# Patient Record
Sex: Female | Born: 1940 | Race: Black or African American | Hispanic: No | Marital: Single | Smoking: Former smoker
Health system: Southern US, Community
[De-identification: ages and names within clinical notes are randomized; demographics above are authoritative.]

## PROBLEM LIST (undated history)

## (undated) DIAGNOSIS — I350 Nonrheumatic aortic (valve) stenosis: Secondary | ICD-10-CM

## (undated) DIAGNOSIS — I10 Essential (primary) hypertension: Secondary | ICD-10-CM

## (undated) DIAGNOSIS — I639 Cerebral infarction, unspecified: Secondary | ICD-10-CM

## (undated) DIAGNOSIS — I4891 Unspecified atrial fibrillation: Secondary | ICD-10-CM

## (undated) DIAGNOSIS — I509 Heart failure, unspecified: Secondary | ICD-10-CM

## (undated) DIAGNOSIS — I2510 Atherosclerotic heart disease of native coronary artery without angina pectoris: Secondary | ICD-10-CM

## (undated) HISTORY — DX: Atherosclerotic heart disease of native coronary artery without angina pectoris: I25.10

## (undated) HISTORY — DX: Nonrheumatic aortic (valve) stenosis: I35.0

## (undated) HISTORY — DX: Essential (primary) hypertension: I10

## (undated) HISTORY — DX: Heart failure, unspecified: I50.9

---

## 1978-01-31 HISTORY — PX: CORONARY ARTERY BYPASS GRAFT: SHX141

## 2018-01-14 ENCOUNTER — Emergency Department: Payer: Medicare Other

## 2018-01-14 ENCOUNTER — Inpatient Hospital Stay
Admission: EM | Admit: 2018-01-14 | Discharge: 2018-01-17 | DRG: 305 | Disposition: A | Discharge status: Home / Self Care | Payer: Medicare Other | Attending: Internal Medicine | Admitting: Internal Medicine

## 2018-01-14 ENCOUNTER — Other Ambulatory Visit: Payer: Self-pay

## 2018-01-14 DIAGNOSIS — I080 Rheumatic disorders of both mitral and aortic valves: Secondary | ICD-10-CM | POA: Diagnosis present

## 2018-01-14 DIAGNOSIS — R7989 Other specified abnormal findings of blood chemistry: Secondary | ICD-10-CM | POA: Diagnosis present

## 2018-01-14 DIAGNOSIS — Z8673 Personal history of transient ischemic attack (TIA), and cerebral infarction without residual deficits: Secondary | ICD-10-CM | POA: Diagnosis not present

## 2018-01-14 DIAGNOSIS — Z79899 Other long term (current) drug therapy: Secondary | ICD-10-CM

## 2018-01-14 DIAGNOSIS — Z7901 Long term (current) use of anticoagulants: Secondary | ICD-10-CM | POA: Diagnosis not present

## 2018-01-14 DIAGNOSIS — Z87440 Personal history of urinary (tract) infections: Secondary | ICD-10-CM

## 2018-01-14 DIAGNOSIS — Z5320 Procedure and treatment not carried out because of patient's decision for unspecified reasons: Secondary | ICD-10-CM | POA: Diagnosis present

## 2018-01-14 DIAGNOSIS — Z8249 Family history of ischemic heart disease and other diseases of the circulatory system: Secondary | ICD-10-CM | POA: Diagnosis not present

## 2018-01-14 DIAGNOSIS — I482 Chronic atrial fibrillation, unspecified: Secondary | ICD-10-CM | POA: Diagnosis present

## 2018-01-14 DIAGNOSIS — Y92009 Unspecified place in unspecified non-institutional (private) residence as the place of occurrence of the external cause: Secondary | ICD-10-CM

## 2018-01-14 DIAGNOSIS — R42 Dizziness and giddiness: Secondary | ICD-10-CM

## 2018-01-14 DIAGNOSIS — Z87891 Personal history of nicotine dependence: Secondary | ICD-10-CM | POA: Diagnosis not present

## 2018-01-14 DIAGNOSIS — I161 Hypertensive emergency: Secondary | ICD-10-CM | POA: Diagnosis present

## 2018-01-14 DIAGNOSIS — Z886 Allergy status to analgesic agent status: Secondary | ICD-10-CM | POA: Diagnosis not present

## 2018-01-14 DIAGNOSIS — Z951 Presence of aortocoronary bypass graft: Secondary | ICD-10-CM

## 2018-01-14 DIAGNOSIS — Z7902 Long term (current) use of antithrombotics/antiplatelets: Secondary | ICD-10-CM

## 2018-01-14 DIAGNOSIS — I10 Essential (primary) hypertension: Secondary | ICD-10-CM | POA: Diagnosis present

## 2018-01-14 DIAGNOSIS — G459 Transient cerebral ischemic attack, unspecified: Secondary | ICD-10-CM

## 2018-01-14 DIAGNOSIS — W19XXXA Unspecified fall, initial encounter: Secondary | ICD-10-CM | POA: Diagnosis present

## 2018-01-14 DIAGNOSIS — Z833 Family history of diabetes mellitus: Secondary | ICD-10-CM

## 2018-01-14 DIAGNOSIS — N179 Acute kidney failure, unspecified: Secondary | ICD-10-CM

## 2018-01-14 DIAGNOSIS — I248 Other forms of acute ischemic heart disease: Secondary | ICD-10-CM

## 2018-01-14 HISTORY — DX: Unspecified atrial fibrillation: I48.91

## 2018-01-14 HISTORY — DX: Cerebral infarction, unspecified: I63.9

## 2018-01-14 LAB — CBC WITH DIFFERENTIAL/PLATELET
Abs Immature Granulocytes: 0.02 10*3/uL (ref 0.00–0.07)
Basophils Absolute: 0.1 10*3/uL (ref 0.0–0.1)
Basophils Relative: 2 %
Eosinophils Absolute: 0.2 10*3/uL (ref 0.0–0.5)
Eosinophils Relative: 5 %
HCT: 44.2 % (ref 36.0–46.0)
Hemoglobin: 14 g/dL (ref 12.0–15.0)
IMMATURE GRANULOCYTES: 1 %
Lymphocytes Relative: 24 %
Lymphs Abs: 1.1 10*3/uL (ref 0.7–4.0)
MCH: 31.1 pg (ref 26.0–34.0)
MCHC: 31.7 g/dL (ref 30.0–36.0)
MCV: 98.2 fL (ref 80.0–100.0)
MONOS PCT: 13 %
Monocytes Absolute: 0.6 10*3/uL (ref 0.1–1.0)
Neutro Abs: 2.5 10*3/uL (ref 1.7–7.7)
Neutrophils Relative %: 55 %
Platelets: 152 10*3/uL (ref 150–400)
RBC: 4.5 MIL/uL (ref 3.87–5.11)
RDW: 13.7 % (ref 11.5–15.5)
WBC: 4.4 10*3/uL (ref 4.0–10.5)
nRBC: 0 % (ref 0.0–0.2)

## 2018-01-14 LAB — URINALYSIS, COMPLETE (UACMP) WITH MICROSCOPIC
Bilirubin Urine: NEGATIVE
Glucose, UA: NEGATIVE mg/dL
Hgb urine dipstick: NEGATIVE
Ketones, ur: NEGATIVE mg/dL
Leukocytes, UA: NEGATIVE
Nitrite: NEGATIVE
PH: 5 (ref 5.0–8.0)
Protein, ur: NEGATIVE mg/dL
Specific Gravity, Urine: 1.014 (ref 1.005–1.030)

## 2018-01-14 LAB — BASIC METABOLIC PANEL
Anion gap: 9 (ref 5–15)
BUN: 27 mg/dL — ABNORMAL HIGH (ref 8–23)
CO2: 23 mmol/L (ref 22–32)
Calcium: 9.1 mg/dL (ref 8.9–10.3)
Chloride: 109 mmol/L (ref 98–111)
Creatinine, Ser: 1.15 mg/dL — ABNORMAL HIGH (ref 0.44–1.00)
GFR calc Af Amer: 53 mL/min — ABNORMAL LOW (ref >60)
GFR calc non Af Amer: 46 mL/min — ABNORMAL LOW (ref >60)
Glucose, Bld: 102 mg/dL — ABNORMAL HIGH (ref 70–99)
Potassium: 3.9 mmol/L (ref 3.5–5.1)
Sodium: 141 mmol/L (ref 135–145)

## 2018-01-14 LAB — TROPONIN I
Troponin I: 0.16 ng/mL (ref <0.03)
Troponin I: 0.15 ng/mL (ref <0.03)
Troponin I: 0.16 ng/mL (ref <0.03)
Troponin I: 0.16 ng/mL (ref <0.03)

## 2018-01-14 LAB — GLUCOSE, CAPILLARY: Glucose-Capillary: 94 mg/dL (ref 70–99)

## 2018-01-14 MED ORDER — ACETAMINOPHEN 325 MG PO TABS
650.0000 mg | ORAL_TABLET | Freq: Four times a day (QID) | ORAL | Status: DC | PRN
  Administered 2018-01-16 – 2018-01-17 (×2): 650 mg via ORAL
  Filled 2018-01-14 (×3): qty 2

## 2018-01-14 MED ORDER — METOPROLOL SUCCINATE ER 50 MG PO TB24
50.0000 mg | ORAL_TABLET | Freq: Once | ORAL | Status: AC
  Administered 2018-01-14: 50 mg via ORAL
  Filled 2018-01-14: qty 1

## 2018-01-14 MED ORDER — APIXABAN 2.5 MG PO TABS
2.5000 mg | ORAL_TABLET | Freq: Two times a day (BID) | ORAL | Status: DC
  Administered 2018-01-14 – 2018-01-17 (×6): 2.5 mg via ORAL
  Filled 2018-01-14 (×6): qty 1

## 2018-01-14 MED ORDER — METOPROLOL SUCCINATE ER 50 MG PO TB24
100.0000 mg | ORAL_TABLET | Freq: Every day | ORAL | Status: DC
  Administered 2018-01-15 – 2018-01-16 (×2): 100 mg via ORAL
  Filled 2018-01-14 (×2): qty 2

## 2018-01-14 MED ORDER — ASPIRIN 81 MG PO CHEW
324.0000 mg | CHEWABLE_TABLET | Freq: Once | ORAL | Status: AC
  Administered 2018-01-14: 324 mg via ORAL
  Filled 2018-01-14: qty 4

## 2018-01-14 MED ORDER — ONDANSETRON HCL 4 MG PO TABS: 4.0000 mg | ORAL_TABLET | Freq: Four times a day (QID) | ORAL | Status: DC | PRN

## 2018-01-14 MED ORDER — CLOPIDOGREL BISULFATE 75 MG PO TABS
75.0000 mg | ORAL_TABLET | Freq: Every day | ORAL | Status: DC
  Administered 2018-01-14 – 2018-01-17 (×4): 75 mg via ORAL
  Filled 2018-01-14 (×4): qty 1

## 2018-01-14 MED ORDER — LISINOPRIL 10 MG PO TABS
20.0000 mg | ORAL_TABLET | Freq: Every day | ORAL | Status: DC
  Administered 2018-01-15: 20 mg via ORAL
  Filled 2018-01-14: qty 2

## 2018-01-14 MED ORDER — ACETAMINOPHEN 650 MG RE SUPP: 650.0000 mg | Freq: Four times a day (QID) | RECTAL | Status: DC | PRN

## 2018-01-14 MED ORDER — SODIUM CHLORIDE 0.9% FLUSH: 3.0000 mL | INTRAVENOUS | Status: DC | PRN

## 2018-01-14 MED ORDER — SODIUM CHLORIDE 0.9% FLUSH
3.0000 mL | Freq: Two times a day (BID) | INTRAVENOUS | Status: DC
  Administered 2018-01-14 – 2018-01-16 (×5): 3 mL via INTRAVENOUS

## 2018-01-14 MED ORDER — CARVEDILOL 6.25 MG PO TABS
12.5000 mg | ORAL_TABLET | Freq: Once | ORAL | Status: AC
  Administered 2018-01-14: 12.5 mg via ORAL
  Filled 2018-01-14: qty 2

## 2018-01-14 MED ORDER — SODIUM CHLORIDE 0.9 % IV SOLN: 250.0000 mL | INTRAVENOUS | Status: DC | PRN

## 2018-01-14 MED ORDER — SPIRONOLACTONE 25 MG PO TABS
25.0000 mg | ORAL_TABLET | Freq: Every day | ORAL | Status: DC
  Administered 2018-01-16 – 2018-01-17 (×2): 25 mg via ORAL
  Filled 2018-01-14 (×3): qty 1

## 2018-01-14 MED ORDER — HYDROCODONE-ACETAMINOPHEN 5-325 MG PO TABS
1.0000 | ORAL_TABLET | ORAL | Status: DC | PRN
  Administered 2018-01-14: 2 via ORAL
  Administered 2018-01-16 – 2018-01-17 (×3): 1 via ORAL
  Filled 2018-01-14 (×2): qty 1
  Filled 2018-01-14: qty 2
  Filled 2018-01-14: qty 1

## 2018-01-14 MED ORDER — SODIUM CHLORIDE 0.9 % IV BOLUS
500.0000 mL | Freq: Once | INTRAVENOUS | Status: AC
  Administered 2018-01-14: 500 mL via INTRAVENOUS

## 2018-01-14 MED ORDER — CARVEDILOL 6.25 MG PO TABS
12.5000 mg | ORAL_TABLET | Freq: Two times a day (BID) | ORAL | Status: DC
  Administered 2018-01-15 – 2018-01-16 (×3): 12.5 mg via ORAL
  Filled 2018-01-14 (×3): qty 2

## 2018-01-14 MED ORDER — NICARDIPINE HCL IN NACL 20-0.86 MG/200ML-% IV SOLN
3.0000 mg/h | INTRAVENOUS | Status: DC
  Administered 2018-01-14: 10 mg/h via INTRAVENOUS
  Filled 2018-01-14: qty 200

## 2018-01-14 MED ORDER — SPIRONOLACTONE 25 MG PO TABS
25.0000 mg | ORAL_TABLET | Freq: Once | ORAL | Status: AC
  Administered 2018-01-14: 25 mg via ORAL
  Filled 2018-01-14: qty 1

## 2018-01-14 MED ORDER — COENZYME Q10 100 MG PO CAPS: 1.0000 | ORAL_CAPSULE | Freq: Every day | ORAL | Status: DC

## 2018-01-14 MED ORDER — LABETALOL HCL 5 MG/ML IV SOLN
10.0000 mg | Freq: Once | INTRAVENOUS | Status: AC
  Administered 2018-01-14: 10 mg via INTRAVENOUS
  Filled 2018-01-14: qty 4

## 2018-01-14 MED ORDER — ONDANSETRON HCL 4 MG/2ML IJ SOLN: 4.0000 mg | Freq: Four times a day (QID) | INTRAMUSCULAR | Status: DC | PRN

## 2018-01-14 MED ORDER — MIRTAZAPINE 15 MG PO TABS
15.0000 mg | ORAL_TABLET | Freq: Every day | ORAL | Status: DC
  Administered 2018-01-14 – 2018-01-16 (×3): 15 mg via ORAL
  Filled 2018-01-14 (×3): qty 1

## 2018-01-14 MED ORDER — SENNOSIDES-DOCUSATE SODIUM 8.6-50 MG PO TABS: 1.0000 | ORAL_TABLET | Freq: Every evening | ORAL | Status: DC | PRN

## 2018-01-14 NOTE — ED Notes (Signed)
Pt nurse in another room. Will call back in 5 mins.

## 2018-01-14 NOTE — ED Provider Notes (Signed)
[facility] Emergency Department Provider Note  ____________________________________________  Time seen: Approximately 7:25 AM  I have reviewed the triage vital signs and the nursing notes.   HISTORY  Chief Complaint Dizziness   HPI Valerie Trujillo is a 77 y.o. female with h/o afib on Eliquis, CVA on Plavix with no deficits, DM diet controlled, HTN, recurrent UTIs who presents for evaluation of dizziness.  Patient reports that she got up this morning to go to the bathroom.  When she was coming back to bed she developed sudden onset of dizziness.  She reports that she felt off balance but at the same time felt like she was going to pass out.  She reports that she tumbled and fell onto the dresser but she was able to crawl and called for her knees to come and help her.  She reports that she then fell a weird sensation coming down the right side of her body that she describes as weakness.  At this time the weakness has resolved with patient continues to complain of feeling dizzy/lightheaded.  No chest pain, no headache, no slurred speech or facial droop.  Of note patient was recently started on mirtazapine for sleep and appetite 3 nights ago.  Past Medical History:  Diagnosis Date  . A-fib (HCC)   . Stroke [facility])     Patient Active Problem List   Diagnosis Date Noted  . Hypertensive emergency 01/14/2018  . Uncontrolled hypertension 01/14/2018    Past Surgical History:  Procedure Laterality Date  . CORONARY ARTERY BYPASS GRAFT  1980    Prior to Admission medications   Medication Sig Start Date End Date Taking? Authorizing Provider  apixaban (ELIQUIS) 2.5 MG TABS tablet Take 1 tablet by mouth 2 (two) times daily.   Yes [provider]  carvedilol (COREG) 12.5 MG tablet Take 1 tablet by mouth 2 (two) times daily. 01/10/18 01/10/19 Yes [provider]  clopidogrel (PLAVIX) 75 MG tablet Take 1 tablet by mouth daily.   Yes [provider]    Coenzyme Q10 100 MG capsule Take 1 capsule by mouth daily.   Yes [provider]  lisinopril (PRINIVIL,ZESTRIL) 20 MG tablet Take 1 tablet by mouth daily.   Yes [provider]  metoprolol succinate (TOPROL-XL) 100 MG 24 hr tablet Take 1 tablet by mouth daily. 01/10/18 01/10/19 Yes [provider]  mirtazapine (REMERON) 15 MG tablet Take 1 tablet by mouth at bedtime. 01/10/18 01/10/19 Yes [provider]  spironolactone (ALDACTONE) 25 MG tablet Take 1 tablet by mouth daily. 01/10/18 01/10/19 Yes [provider]    Allergies Aspirin  Family History  Problem Relation Age of Onset  . Heart disease Mother   . Diabetes Mellitus II Father   . Heart disease Father     Social History Social History   Tobacco Use  . Smoking status: Former Games developer  . Smokeless tobacco: Never Used  Substance Use Topics  . Alcohol use: Never    Frequency: Never  . Drug use: Never    Review of Systems  Constitutional: Negative for fever. Eyes: Negative for visual changes. ENT: Negative for sore throat. Neck: No neck pain  Cardiovascular: Negative for chest pain. Respiratory: Negative for shortness of breath. Gastrointestinal: Negative for abdominal pain, vomiting or diarrhea. Genitourinary: Negative for dysuria. Musculoskeletal: Negative for back pain. Skin: Negative for rash. Neurological: Negative for headaches. + dizziness and R sided weakness Psych: No SI or HI  ____________________________________________   PHYSICAL EXAM:  VITAL  SIGNS: ED Triage Vitals  Enc Vitals Group     BP 01/14/18 0721 (!) 197/98     Pulse Rate 01/14/18 0721 86     Resp 01/14/18 0721 (!) 21     Temp 01/14/18 0721 97.8 F (36.6 C)     Temp Source 01/14/18 0721 Oral     SpO2 01/14/18 0721 95 %     Weight 01/14/18 0711 177 lb (80.3 kg)     Height 01/14/18 0711 5\' 5"  (1.651 m)     Head Circumference --      Peak Flow --      Pain Score 01/14/18 0711 0     Pain Loc  --      Pain Edu? --      Excl. in GC? --     Constitutional: Alert and oriented. Well appearing and in no apparent distress. HEENT:      Head: Normocephalic and atraumatic.         Eyes: Conjunctivae are normal. Sclera is non-icteric.       Mouth/Throat: Mucous membranes are moist.       Neck: Supple with no signs of meningismus. Cardiovascular: Regular rate and rhythm. No murmurs, gallops, or rubs. 2+ symmetrical distal pulses are present in all extremities. No JVD. Respiratory: Normal respiratory effort. Lungs are clear to auscultation bilaterally. No wheezes, crackles, or rhonchi.  Gastrointestinal: Soft, non tender, and non distended with positive bowel sounds. No rebound or guarding. Musculoskeletal: Nontender with normal range of motion in all extremities. No edema, cyanosis, or erythema of extremities. Neurologic: Normal speech and language. A & O x3, PERRL, EOMI, no nystagmus, CN II-XII intact, motor testing reveals good tone and bulk throughout. There is no evidence of pronator drift or dysmetria. Muscle strength is 5/5 throughout. Sensory examination is intact. Gait is normal. Skin: Skin is warm, dry and intact. No rash noted. Psychiatric: Mood and affect are normal. Speech and behavior are normal.  ____________________________________________   LABS (all labs ordered are listed, but only abnormal results are displayed)  Labs Reviewed  BASIC METABOLIC PANEL - Abnormal; Notable for the following components:      Result Value   Glucose, Bld 102 (*)    BUN 27 (*)    Creatinine, Ser 1.15 (*)    GFR calc non Af Amer 46 (*)    GFR calc Af Amer 53 (*)    All other components within normal limits  TROPONIN I - Abnormal; Notable for the following components:   Troponin I 0.16 (*)    All other components within normal limits  URINALYSIS, COMPLETE (UACMP) WITH MICROSCOPIC - Abnormal; Notable for the following components:   Color, Urine YELLOW (*)    APPearance CLEAR (*)     Bacteria, UA RARE (*)    All other components within normal limits  TROPONIN I - Abnormal; Notable for the following components:   Troponin I 0.15 (*)    All other components within normal limits  CBC WITH DIFFERENTIAL/PLATELET  GLUCOSE, CAPILLARY  TROPONIN I  TROPONIN I  TROPONIN I  CBG MONITORING, ED   ____________________________________________  EKG  ED ECG REPORT I, Nita Sickle, the attending physician, personally viewed and interpreted this ECG.  Atrial fibrillation, rate of 92, LVH, borderline QTC, normal axis, T wave inversions in inferior and lateral leads, slight ST depressions in 1 and aVL with no ST elevations.  No prior for comparison. ____________________________________________  RADIOLOGY  I have personally reviewed the images performed during this  visit and I agree with the Radiologist's read.   Interpretation by Radiologist:  Ct Head Wo Contrast  Result Date: 01/14/2018 CLINICAL DATA:  Dizziness.  Multiple falls. EXAM: CT HEAD WITHOUT CONTRAST TECHNIQUE: Contiguous axial images were obtained from the base of the skull through the vertex without intravenous contrast. COMPARISON:  None. FINDINGS: Brain: No subdural, epidural, or subarachnoid hemorrhage. Cerebellum, brainstem, and basal cisterns are normal. Ventricles and sulci are mildly prominent but otherwise unremarkable. Mild white matter changes. Lacunar infarct in the right basal ganglia, nonacute appearance. Basal gangliar calcifications are of no significance. No acute cortical ischemia or infarct. No mass effect or midline shift. Vascular: Calcified atherosclerosis in the intracranial carotids. Skull: Previous left craniotomy.  No other calvarial abnormalities. Sinuses/Orbits: The paranasal sinuses are unremarkable. The middle ears are well aerated. There is fluid in inferior posterior right mastoid air cells without identified fracture or adjacent soft tissue swelling. The left mastoid air cells are well  aerated as are the middle ears. Other: None. IMPRESSION: 1. No acute intracranial abnormalities identified. Chronic white matter changes and right basal gangliar lacunar infarct. 2. Fluid in the right mastoid air cells without bony erosion, fracture, or soft tissue swelling identified. The findings are nonspecific. Electronically Signed   By: Gerome Samavid  Williams III M.D   On: 01/14/2018 07:56   Mr Brain Wo Contrast  Result Date: 01/14/2018 CLINICAL DATA:  Patient had "a feeling come over her," resulting in an inability to walk. Presyncope. History of stroke. History of atrial fibrillation. EXAM: MRI HEAD WITHOUT CONTRAST TECHNIQUE: Multiplanar, multiecho pulse sequences of the brain and surrounding structures were obtained without intravenous contrast. COMPARISON:  CT head earlier today. FINDINGS: Brain: No evidence for acute infarction, hemorrhage, mass lesion, hydrocephalus, or extra-axial fluid. Moderate cerebral and cerebellar atrophy. Mild to moderate T2 and FLAIR hyperintensities in the white matter, likely small vessel disease. Chronic RIGHT basal ganglia infarct. Vascular: Flow voids are maintained in the carotid, basilar, and vertebral arteries. There is a tiny focus of chronic hemorrhage in the RIGHT frontal operculum, likely ischemic. Slight susceptibility is also seen associated with the prior RIGHT basal ganglia insult. Skull and upper cervical spine: There has been LEFT frontotemporal craniotomy, probably for subdural hematoma evacuation. No residual extra-axial fluid. Partial empty sella with expansion. Small focus of dural ectasia in the anterior aspect of the LEFT middle cranial fossa, with no evidence of brain herniation. Sinuses/Orbits: No paranasal sinus disease. BILATERAL cataract extraction. 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 Other: Trace RIGHT mastoid effusion.  No nasopharyngeal process. IMPRESSION: Atrophy and small vessel disease. Chronic RIGHT basal ganglia infarct. Foci  of chronic hemorrhage RIGHT hemisphere, likely ischemic. Changes of prior LEFT frontotemporal craniotomy, likely for subdural hematoma evacuation. No residual extra-axial fluid. No acute intracranial findings are evident. Electronically Signed   By: Elsie StainJohn T Curnes M.D.   On: 01/14/2018 10:42      ____________________________________________   PROCEDURES  Procedure(s) performed: None Procedures Critical Care performed: yes  CRITICAL CARE Performed by: Nita Sicklearolina Damilola Flamm  ?  Total critical care time: 35 min  Critical care time was exclusive of separately billable procedures and treating other patients.  Critical care was necessary to treat or prevent imminent or life-threatening deterioration.  Critical care was time spent personally by me on the following activities: development of treatment plan with patient and/or surrogate as well as nursing, discussions with consultants, evaluation of patient's response to treatment, examination of patient, obtaining history from patient or surrogate, ordering and performing treatments and  interventions, ordering and review of laboratory studies, ordering and review of radiographic studies, pulse oximetry and re-evaluation of patient's condition.  ____________________________________________   INITIAL IMPRESSION / ASSESSMENT AND PLAN / ED COURSE   77 y.o. female with h/o afib on Eliquis, CVA on Plavix with no deficits, DM diet controlled, HTN, recurrent UTIs who presents for evaluation of dizziness/lightheadedness leading to fall and associated with a brief episode of right-sided sensation of weakness but no true weakness.  Patient is currently neurologically intact.  She is hypertensive.  No signs of trauma.  She is on 2 blood thinners including Plavix and Eliquis.  She is currently in atrial fibrillation with well-controlled rate.  She does have mild ST depressions in her EKG with no prior for comparison. Patient also recently taken off of glipizide  and not currently on any DM medications. Ddx broad and includes CVA vs TIA vs UTI vs dehydration vs electrolyte abnormalities vs ACS vs hyper/hypoglycemia vs side effect of mirtazapine. Will get labs, CT head, UA and reassess  Clinical Course as of Jan 15 1604  Sun Jan 14, 2018  1914 Patient's first troponin is elevated at 0.16 with abnormal EKG.  Unfortunately I do not have prior records from patient since she recently moved from [place].  She continues to deny any chest pain or shortness of breath.  I am concerned she could have had a small stroke which could be causing her troponin and EKG to be abnormal.  Therefore even though she has not taken her morning medications at this time I will hold off on giving her antihypertensive medications in case she did have a stroke into the MRI is done.  Also with no chest pain I will hold off starting patient on heparin at this time.  Will give a full dose of aspirin.    [CV]    Clinical Course User Index [CV] Don Perking [place], MD   _________________________ 10:42 AM on 01/14/2018 ----------------------------------------- MRI negative for acute stroke.  Second troponin is unchanged.  Patient remains with no chest pain or shortness of breath.  Elevated troponin most likely demand ischemia plus elevated creatinine.  Patient was admitted to the hospitalist service for further evaluation   As part of my medical decision making, I reviewed the following data within the electronic MEDICAL RECORD NUMBER Nursing notes reviewed and incorporated, Labs reviewed , EKG interpreted , Radiograph reviewed , Discussed with admitting physician , Notes from prior ED visits and [place] Controlled Substance Database    Pertinent labs & imaging results that were available during my care of the patient were reviewed by me and considered in my medical decision making (see chart for details).    ____________________________________________   FINAL CLINICAL IMPRESSION(S) / ED  DIAGNOSES  Final diagnoses:  Vertigo  TIA (transient ischemic attack)  AKI (acute kidney injury) (HCC)  Demand ischemia of myocardium (HCC)      NEW MEDICATIONS STARTED DURING THIS VISIT:  ED Discharge Orders    None       Note:  This document was prepared using Dragon voice recognition software and may include unintentional dictation errors.    Nita Sickle, MD 01/14/18 [phone]

## 2018-01-14 NOTE — Progress Notes (Signed)
Advanced care plan.  Purpose of the Encounter: CODE STATUS  Parties in Attendance: Patient and family  Patient's Decision Capacity: Good  Subjective/Patient's story: Presented with dizziness  Objective/Medical story Patient has elevated blood pressure systolic more than 230 mmHg She needs IV nicardipine drip for control of blood pressure She also needs evaluation for stroke Troponin has been elevated  Needs Cardiology evaluation  Goals of care determination:  Advance care directives goals of care and treatment plan discussed Patient for now wants everything done which includes CPR, intubation ventilator if the need arises  CODE STATUS: Full code  Time spent discussing advanced care planning: 16 minutes

## 2018-01-14 NOTE — ED Triage Notes (Addendum)
Pt comes from home via [facility] after she "had a feeling come over her" and she had to get to her knees and crawl back to her bed to get her niece. She reports feeling like she is going to pass out. Weakness on right side and "nausea in her head". Hx of stroke and CABG.  BP with EMS was 211/109. She also started a new medication for sleep mirtazapine. CBG 128. No dizziness at this time.

## 2018-01-14 NOTE — H&P (Signed)
[facility] - [place] at [facility]   PATIENT NAME: Valerie Trujillo    MR#:  [id number]  DATE OF BIRTH:  20-Jan-1941  DATE OF ADMISSION:  01/14/2018  PRIMARY CARE PHYSICIAN: Woodroe Chen, MD   REQUESTING/REFERRING PHYSICIAN:   CHIEF COMPLAINT:   Chief Complaint  Patient presents with  . Dizziness    HISTORY OF PRESENT ILLNESS: Valerie Trujillo  is a 77 y.o. female with a known history of atrial fibrillation on Eliquis for anticoagulation, CVA, hypertension on Plavix at home presented to the emergency room for dizziness.  Patient after waking up this morning felt dizzy she felt like her right side of the body is giving away.  Patient recently moved from [place] to [place] and do settle down here.  She was evaluated in the emergency room her systolic blood pressure was more than 230 mmHg and diastolic more than 130 mm of hg.  Patient was worked up for stroke with CT head and MRI brain which did not show any acute abnormality.  MRI brain showed right chronic basal ganglia infarct and foci of chronic hemorrhage in the right side which are ischemic.  Patient was started on IV nicardipine drip in the emergency room to control blood pressure.  Troponin is elevated but no complaints of chest pain.  EKG normal sinus rhythm with no ST segment elevation.  We do not have any old EKG to compare as patient has moved from [place].  PAST MEDICAL HISTORY:   Past Medical History:  Diagnosis Date  . A-fib (HCC)   . Stroke [facility])     PAST SURGICAL HISTORY:  Past Surgical History:  Procedure Laterality Date  . CORONARY ARTERY BYPASS GRAFT  1980    SOCIAL HISTORY:  Social History   Tobacco Use  . Smoking status: Former Games developer  . Smokeless tobacco: Never Used  Substance Use Topics  . Alcohol use: Never    Frequency: Never    FAMILY HISTORY:  Family History  Problem Relation Age of Onset  . Heart disease Mother   . Diabetes Mellitus II Father   . Heart disease  Father     DRUG ALLERGIES: No Known Allergies  REVIEW OF SYSTEMS:   CONSTITUTIONAL: No fever, fatigue or weakness.  EYES: No blurred or double vision.  EARS, NOSE, AND THROAT: No tinnitus or ear pain.  RESPIRATORY: No cough, shortness of breath, wheezing or hemoptysis.  CARDIOVASCULAR: No chest pain, orthopnea, edema.  GASTROINTESTINAL: No nausea, vomiting, diarrhea or abdominal pain.  GENITOURINARY: No dysuria, hematuria.  ENDOCRINE: No polyuria, nocturia,  HEMATOLOGY: No anemia, easy bruising or bleeding SKIN: No rash or lesion. MUSCULOSKELETAL: No joint pain or arthritis.   NEUROLOGIC: No tingling, numbness, weakness.  Has dizziness PSYCHIATRY: No anxiety or depression.   MEDICATIONS AT HOME:  Prior to Admission medications   Not on File      PHYSICAL EXAMINATION:   VITAL SIGNS: Blood pressure 129/78, pulse 85, temperature 97.8 F (36.6 C), temperature source Oral, resp. rate (!) 28, height 5\' 5"  (1.651 m), weight 80.3 kg, SpO2 94 %.  GENERAL:  77 y.o.-year-old patient lying in the bed with no acute distress.  EYES: Pupils equal, round, reactive to light and accommodation. No scleral icterus. Extraocular muscles intact.  HEENT: Head atraumatic, normocephalic. Oropharynx and nasopharynx clear.  NECK:  Supple, no jugular venous distention. No thyroid enlargement, no tenderness.  LUNGS: Normal breath sounds bilaterally, no wheezing, rales,rhonchi or crepitation. No use of accessory muscles of respiration.  CARDIOVASCULAR:  S1, S2 normal. No murmurs, rubs, or gallops.  ABDOMEN: Soft, nontender, nondistended. Bowel sounds present. No organomegaly or mass.  EXTREMITIES: No pedal edema, cyanosis, or clubbing.  NEUROLOGIC: Cranial nerves II through XII are intact. Muscle strength 5/5 in all extremities. Sensation intact. Gait not checked.  PSYCHIATRIC: The patient is alert and oriented x 3.  SKIN: No obvious rash, lesion, or ulcer.   LABORATORY PANEL:   CBC Recent Labs  Lab  01/14/18 0721  WBC 4.4  HGB 14.0  HCT 44.2  PLT 152  MCV 98.2  MCH 31.1  MCHC 31.7  RDW 13.7  LYMPHSABS 1.1  MONOABS 0.6  EOSABS 0.2  BASOSABS 0.1   ------------------------------------------------------------------------------------------------------------------  Chemistries  Recent Labs  Lab 01/14/18 0721  NA 141  K 3.9  CL 109  CO2 23  GLUCOSE 102*  BUN 27*  CREATININE 1.15*  CALCIUM 9.1   ------------------------------------------------------------------------------------------------------------------ estimated creatinine clearance is 42.2 mL/min (A) (by C-G formula based on SCr of 1.15 mg/dL (H)). ------------------------------------------------------------------------------------------------------------------ No results for input(s): TSH, T4TOTAL, T3FREE, THYROIDAB in the last 72 hours.  Invalid input(s): FREET3   Coagulation profile No results for input(s): INR, PROTIME in the last 168 hours. ------------------------------------------------------------------------------------------------------------------- No results for input(s): DDIMER in the last 72 hours. -------------------------------------------------------------------------------------------------------------------  Cardiac Enzymes Recent Labs  Lab 01/14/18 0721 01/14/18 1043  TROPONINI 0.16* 0.15*   ------------------------------------------------------------------------------------------------------------------ Invalid input(s): POCBNP  ---------------------------------------------------------------------------------------------------------------  Urinalysis    Component Value Date/Time   COLORURINE YELLOW (A) 01/14/2018 0809   APPEARANCEUR CLEAR (A) 01/14/2018 0809   LABSPEC 1.014 01/14/2018 0809   PHURINE 5.0 01/14/2018 0809   GLUCOSEU NEGATIVE 01/14/2018 0809   HGBUR NEGATIVE 01/14/2018 0809   BILIRUBINUR NEGATIVE 01/14/2018 0809   KETONESUR NEGATIVE 01/14/2018 0809   PROTEINUR  NEGATIVE 01/14/2018 0809   NITRITE NEGATIVE 01/14/2018 0809   LEUKOCYTESUR NEGATIVE 01/14/2018 0809     RADIOLOGY: Ct Head Wo Contrast  Result Date: 01/14/2018 CLINICAL DATA:  Dizziness.  Multiple falls. EXAM: CT HEAD WITHOUT CONTRAST TECHNIQUE: Contiguous axial images were obtained from the base of the skull through the vertex without intravenous contrast. COMPARISON:  None. FINDINGS: Brain: No subdural, epidural, or subarachnoid hemorrhage. Cerebellum, brainstem, and basal cisterns are normal. Ventricles and sulci are mildly prominent but otherwise unremarkable. Mild white matter changes. Lacunar infarct in the right basal ganglia, nonacute appearance. Basal gangliar calcifications are of no significance. No acute cortical ischemia or infarct. No mass effect or midline shift. Vascular: Calcified atherosclerosis in the intracranial carotids. Skull: Previous left craniotomy.  No other calvarial abnormalities. Sinuses/Orbits: The paranasal sinuses are unremarkable. The middle ears are well aerated. There is fluid in inferior posterior right mastoid air cells without identified fracture or adjacent soft tissue swelling. The left mastoid air cells are well aerated as are the middle ears. Other: None. IMPRESSION: 1. No acute intracranial abnormalities identified. Chronic white matter changes and right basal gangliar lacunar infarct. 2. Fluid in the right mastoid air cells without bony erosion, fracture, or soft tissue swelling identified. The findings are nonspecific. Electronically Signed   By: Gerome Sam III M.D   On: 01/14/2018 07:56   Mr Brain Wo Contrast  Result Date: 01/14/2018 CLINICAL DATA:  Patient had "a feeling come over her," resulting in an inability to walk. Presyncope. History of stroke. History of atrial fibrillation. EXAM: MRI HEAD WITHOUT CONTRAST TECHNIQUE: Multiplanar, multiecho pulse sequences of the brain and surrounding structures were obtained without intravenous contrast.  COMPARISON:  CT head earlier today. FINDINGS: Brain: No evidence for  acute infarction, hemorrhage, mass lesion, hydrocephalus, or extra-axial fluid. Moderate cerebral and cerebellar atrophy. Mild to moderate T2 and FLAIR hyperintensities in the white matter, likely small vessel disease. Chronic RIGHT basal ganglia infarct. Vascular: Flow voids are maintained in the carotid, basilar, and vertebral arteries. There is a tiny focus of chronic hemorrhage in the RIGHT frontal operculum, likely ischemic. Slight susceptibility is also seen associated with the prior RIGHT basal ganglia insult. Skull and upper cervical spine: There has been LEFT frontotemporal craniotomy, probably for subdural hematoma evacuation. No residual extra-axial fluid. Partial empty sella with expansion. Small focus of dural ectasia in the anterior aspect of the LEFT middle cranial fossa, with no evidence of brain herniation. Sinuses/Orbits: No paranasal sinus disease. BILATERAL cataract extraction. 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 Other: Trace RIGHT mastoid effusion.  No nasopharyngeal process. IMPRESSION: Atrophy and small vessel disease. Chronic RIGHT basal ganglia infarct. Foci of chronic hemorrhage RIGHT hemisphere, likely ischemic. Changes of prior LEFT frontotemporal craniotomy, likely for subdural hematoma evacuation. No residual extra-axial fluid. No acute intracranial findings are evident. Electronically Signed   By: Elsie StainJohn T Curnes M.D.   On: 01/14/2018 10:42    EKG: Orders placed or performed during the hospital encounter of 01/14/18  . ED EKG  . ED EKG    IMPRESSION AND PLAN: 77 year old female patient with history of CVA, hypertension, chronic atrial fibrillation presented to the emergency room for dizziness and elevated blood better controlled  -Hypertensive emergency Blood pressure was brought down to 120 / 80 mmHg by nicardipine drip Will turn off the drip and start patient on oral blood pressure  medications Admit patient to telemetry Resume oral blood pressure medications after home meds are reconciled Check echocardiogram  -Vertigo Could be secondary from high blood pressure PRN meclizine  -History of CVA Resume Plavix and statin medication CT head and MRI brain showed no acute abnormality  -Chronic atrial fibrillation Will resume Eliquis for anticoagulation after medications have been reconciled  -Abnormal troponin No complaints of chest pain EKG no ST segment elevation Cycle troponin cardiology consultation Beta-blocker and statin medication to continue Continue Plavix  All the records are reviewed and case discussed with ED provider. Management plans discussed with the patient, family and they are in agreement.  CODE STATUS:Full code  TOTAL TIME TAKING CARE OF THIS PATIENT: 52 minutes.    Ihor AustinPavan Pyreddy M.D on 01/14/2018 at 11:36 AM  Between 7am to 6pm - Pager - [phone]  After 6pm go to [email or web address] - password [email or web address] [email or web address]  [facility]  Office  [phone]  CC: Primary care physician; Woodroe Chenrickamer, Margaret A, MD

## 2018-01-14 NOTE — ED Notes (Addendum)
Pt to MRI with RN. Shanda BumpsJessica RN will remain in MRI with pt as BP remains elevated and labs abnormal. Dr Don Perkingveronese is aware of bp

## 2018-01-15 ENCOUNTER — Inpatient Hospital Stay
Admit: 2018-01-15 | Discharge: 2018-01-15 | Disposition: A | Discharge status: Home / Self Care | Payer: Medicare Other | Attending: Internal Medicine | Admitting: Internal Medicine

## 2018-01-15 LAB — CBC
HCT: 39.7 % (ref 36.0–46.0)
Hemoglobin: 12.9 g/dL (ref 12.0–15.0)
MCH: 31.1 pg (ref 26.0–34.0)
MCHC: 32.5 g/dL (ref 30.0–36.0)
MCV: 95.7 fL (ref 80.0–100.0)
Platelets: 133 10*3/uL — ABNORMAL LOW (ref 150–400)
RBC: 4.15 MIL/uL (ref 3.87–5.11)
RDW: 13.6 % (ref 11.5–15.5)
WBC: 5.1 10*3/uL (ref 4.0–10.5)
nRBC: 0 % (ref 0.0–0.2)

## 2018-01-15 LAB — BASIC METABOLIC PANEL
Anion gap: 8 (ref 5–15)
BUN: 26 mg/dL — AB (ref 8–23)
CO2: 23 mmol/L (ref 22–32)
Calcium: 8.6 mg/dL — ABNORMAL LOW (ref 8.9–10.3)
Chloride: 110 mmol/L (ref 98–111)
Creatinine, Ser: 1.15 mg/dL — ABNORMAL HIGH (ref 0.44–1.00)
GFR calc Af Amer: 53 mL/min — ABNORMAL LOW (ref >60)
GFR calc non Af Amer: 46 mL/min — ABNORMAL LOW (ref >60)
Glucose, Bld: 95 mg/dL (ref 70–99)
Potassium: 3.9 mmol/L (ref 3.5–5.1)
SODIUM: 141 mmol/L (ref 135–145)

## 2018-01-15 LAB — TROPONIN I: Troponin I: 0.15 ng/mL (ref <0.03)

## 2018-01-15 MED ORDER — LISINOPRIL 20 MG PO TABS
20.0000 mg | ORAL_TABLET | Freq: Once | ORAL | Status: AC
  Administered 2018-01-15: 20 mg via ORAL
  Filled 2018-01-15: qty 1

## 2018-01-15 MED ORDER — METOPROLOL TARTRATE 5 MG/5ML IV SOLN
5.0000 mg | Freq: Four times a day (QID) | INTRAVENOUS | Status: DC | PRN
  Administered 2018-01-15 – 2018-01-16 (×2): 5 mg via INTRAVENOUS
  Filled 2018-01-15 (×3): qty 5

## 2018-01-15 MED ORDER — HYDROCHLOROTHIAZIDE 12.5 MG PO CAPS
12.5000 mg | ORAL_CAPSULE | Freq: Every day | ORAL | Status: DC
  Administered 2018-01-15 – 2018-01-17 (×3): 12.5 mg via ORAL
  Filled 2018-01-15 (×3): qty 1

## 2018-01-15 MED ORDER — LISINOPRIL 20 MG PO TABS
40.0000 mg | ORAL_TABLET | Freq: Every day | ORAL | Status: DC
  Administered 2018-01-16 – 2018-01-17 (×2): 40 mg via ORAL
  Filled 2018-01-15 (×2): qty 2

## 2018-01-15 MED ORDER — CLONIDINE HCL 0.1 MG/24HR TD PTWK
0.1000 mg | MEDICATED_PATCH | TRANSDERMAL | Status: DC
  Administered 2018-01-15: 0.1 mg via TRANSDERMAL
  Filled 2018-01-15: qty 1

## 2018-01-15 NOTE — Consult Note (Signed)
Cardiology Consultation Note    Patient ID: Valerie Trujillo, MRN: [id number], DOB/AGE: 1941/01/24 77 y.o. Admit date: 01/14/2018   Date of Consult: 01/15/2018 Primary Physician: Woodroe Chenrickamer, Margaret A, MD Primary Cardiologist: none  Chief Complaint: hypertension Reason for Consultation: hypertension Requesting MD: Dr. Tobi BastosPyreddy  HPI: Valerie Trujillo is a 77 y.o. female with history of afib treated with carvedilol and metoprolol at present as well as apixaban.    She also has been on lisinopril and spironolactone.  [place] ago.  She has not established care here.  She states she has had intermittent high and low blood pressures.  She presented to the emergency room with dizziness and was noted to be profoundly hypertensive.  Brain CT revealed no acute intracranial abnormalities.  Chronic white matter changes and right basal ganglia lacunar infarct.  Brain MRI revealed atrophy and small vessel change with chronic right basal ganglia infarct.  Foci of chronic hemorrhage in the right hemisphere likely ischemic.  Changes of prior left frontotemporal craniotomy likely for subdural evacuation.  No residual extra-axial fluid was noted.  No acute intracranial findings were noted.  She was profoundly hypertensive on presentation and was given nicardipine.  She states she has had coronary artery bypass grafting in [place] quite a few years ago.  Records are currently not available.  She is establish care with a primary care physician.  Her primary care physician has started converting her from metoprolol to carvedilol and started on hydrochlorothiazide.  She denies chest pain.  She states her dizziness have improved.  Systolic blood pressure per EMS was 211.  Past Medical History:  Diagnosis Date  . A-fib (HCC)   . Stroke [facility](HCC)       Surgical History:  Past Surgical History:  Procedure Laterality Date  . CORONARY ARTERY BYPASS GRAFT  1980     Home  Meds: Prior to Admission medications   Medication Sig Start Date End Date Taking? Authorizing Provider  apixaban (ELIQUIS) 2.5 MG TABS tablet Take 1 tablet by mouth 2 (two) times daily.   Yes [provider]  carvedilol (COREG) 12.5 MG tablet Take 1 tablet by mouth 2 (two) times daily. 01/10/18 01/10/19 Yes [provider]  clopidogrel (PLAVIX) 75 MG tablet Take 1 tablet by mouth daily.   Yes [provider]  Coenzyme Q10 100 MG capsule Take 1 capsule by mouth daily.   Yes [provider]  lisinopril (PRINIVIL,ZESTRIL) 20 MG tablet Take 1 tablet by mouth daily.   Yes [provider]  metoprolol succinate (TOPROL-XL) 100 MG 24 hr tablet Take 1 tablet by mouth daily. 01/10/18 01/10/19 Yes [provider]  mirtazapine (REMERON) 15 MG tablet Take 1 tablet by mouth at bedtime. 01/10/18 01/10/19 Yes [provider]  spironolactone (ALDACTONE) 25 MG tablet Take 1 tablet by mouth daily. 01/10/18 01/10/19 Yes [provider]    Inpatient Medications:  . apixaban  2.5 mg Oral BID  . carvedilol  12.5 mg Oral BID WC  . clopidogrel  75 mg Oral Daily  . lisinopril  20 mg Oral Daily  . metoprolol succinate  100 mg Oral Daily  . mirtazapine  15 mg Oral QHS  . sodium chloride flush  3 mL Intravenous Q12H  . spironolactone  25 mg Oral Daily   . sodium chloride    . niCARDipine Stopped (01/14/18 1137)    Allergies:  Allergies  Allergen Reactions  . Aspirin Palpitations  Per Pt: Makes heart flutter.    Social History   Socioeconomic History  . Marital status: Single    Spouse name: Not on file  . Number of children: Not on file  . Years of education: Not on file  . Highest education level: Not on file  Occupational History  . Occupation: retired  Engineer, production  . Financial resource strain: Not on file  . Food insecurity:    Worry: Not on file    Inability: Not on file  . Transportation needs:    Medical: Not on  file    Non-medical: Not on file  Tobacco Use  . Smoking status: Former Games developer  . Smokeless tobacco: Never Used  Substance and Sexual Activity  . Alcohol use: Never    Frequency: Never  . Drug use: Never  . Sexual activity: Not Currently  Lifestyle  . Physical activity:    Days per week: Not on file    Minutes per session: Not on file  . Stress: Not on file  Relationships  . Social connections:    Talks on phone: Not on file    Gets together: Not on file    Attends religious service: Not on file    Active member of club or organization: Not on file    Attends meetings of clubs or organizations: Not on file    Relationship status: Not on file  . Intimate partner violence:    Fear of current or ex partner: Not on file    Emotionally abused: Not on file    Physically abused: Not on file    Forced sexual activity: Not on file  Other Topics Concern  . Not on file  Social History Narrative  . Not on file     Family History  Problem Relation Age of Onset  . Heart disease Mother   . Diabetes Mellitus II Father   . Heart disease Father      Review of Systems: A 12-system review of systems was performed and is negative except as noted in the HPI.  Labs: Recent Labs    01/14/18 1043 01/14/18 1614 01/14/18 2133 01/15/18 0653  TROPONINI 0.15* 0.16* 0.16* 0.15*   Lab Results  Component Value Date   WBC 5.1 01/15/2018   HGB 12.9 01/15/2018   HCT 39.7 01/15/2018   MCV 95.7 01/15/2018   PLT 133 (L) 01/15/2018    Recent Labs  Lab 01/14/18 0721  NA 141  K 3.9  CL 109  CO2 23  BUN 27*  CREATININE 1.15*  CALCIUM 9.1  GLUCOSE 102*   No results found for: CHOL, HDL, LDLCALC, TRIG No results found for: DDIMER  Radiology/Studies:  Ct Head Wo Contrast  Result Date: 01/14/2018 CLINICAL DATA:  Dizziness.  Multiple falls. EXAM: CT HEAD WITHOUT CONTRAST TECHNIQUE: Contiguous axial images were obtained from the base of the skull through the vertex without intravenous  contrast. COMPARISON:  None. FINDINGS: Brain: No subdural, epidural, or subarachnoid hemorrhage. Cerebellum, brainstem, and basal cisterns are normal. Ventricles and sulci are mildly prominent but otherwise unremarkable. Mild white matter changes. Lacunar infarct in the right basal ganglia, nonacute appearance. Basal gangliar calcifications are of no significance. No acute cortical ischemia or infarct. No mass effect or midline shift. Vascular: Calcified atherosclerosis in the intracranial carotids. Skull: Previous left craniotomy.  No other calvarial abnormalities. Sinuses/Orbits: The paranasal sinuses are unremarkable. The middle ears are well aerated. There is fluid in inferior posterior right mastoid air cells without identified fracture  or adjacent soft tissue swelling. The left mastoid air cells are well aerated as are the middle ears. Other: None. IMPRESSION: 1. No acute intracranial abnormalities identified. Chronic white matter changes and right basal gangliar lacunar infarct. 2. Fluid in the right mastoid air cells without bony erosion, fracture, or soft tissue swelling identified. The findings are nonspecific. Electronically Signed   By: Gerome Sam III M.D   On: 01/14/2018 07:56   Mr Brain Wo Contrast  Result Date: 01/14/2018 CLINICAL DATA:  Patient had "a feeling come over her," resulting in an inability to walk. Presyncope. History of stroke. History of atrial fibrillation. EXAM: MRI HEAD WITHOUT CONTRAST TECHNIQUE: Multiplanar, multiecho pulse sequences of the brain and surrounding structures were obtained without intravenous contrast. COMPARISON:  CT head earlier today. FINDINGS: Brain: No evidence for acute infarction, hemorrhage, mass lesion, hydrocephalus, or extra-axial fluid. Moderate cerebral and cerebellar atrophy. Mild to moderate T2 and FLAIR hyperintensities in the white matter, likely small vessel disease. Chronic RIGHT basal ganglia infarct. Vascular: Flow voids are maintained in  the carotid, basilar, and vertebral arteries. There is a tiny focus of chronic hemorrhage in the RIGHT frontal operculum, likely ischemic. Slight susceptibility is also seen associated with the prior RIGHT basal ganglia insult. Skull and upper cervical spine: There has been LEFT frontotemporal craniotomy, probably for subdural hematoma evacuation. No residual extra-axial fluid. Partial empty sella with expansion. Small focus of dural ectasia in the anterior aspect of the LEFT middle cranial fossa, with no evidence of brain herniation. Sinuses/Orbits: No paranasal sinus disease. BILATERAL cataract extraction. 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 Other: Trace RIGHT mastoid effusion.  No nasopharyngeal process. IMPRESSION: Atrophy and small vessel disease. Chronic RIGHT basal ganglia infarct. Foci of chronic hemorrhage RIGHT hemisphere, likely ischemic. Changes of prior LEFT frontotemporal craniotomy, likely for subdural hematoma evacuation. No residual extra-axial fluid. No acute intracranial findings are evident. Electronically Signed   By: Elsie Stain M.D.   On: 01/14/2018 10:42    Wt Readings from Last 3 Encounters:  01/14/18 81.7 kg    EKG: nsr with no ischemia  Physical Exam:  Blood pressure (!) 160/82, pulse 79, temperature (!) 97 F (36.1 C), resp. rate 12, height 5\' 5"  (1.651 m), weight 81.7 kg, SpO2 98 %. Body mass index is 29.97 kg/m. General: Well developed, well nourished, in no acute distress. Head: Normocephalic, atraumatic, sclera non-icteric, no xanthomas, nares are without discharge.  Neck: Negative for carotid bruits. JVD not elevated. Lungs: Clear bilaterally to auscultation without wheezes, rales, or rhonchi. Breathing is unlabored. Heart: RRR with S1 S2. No murmurs, rubs, or gallops appreciated. Abdomen: Soft, non-tender, non-distended with normoactive bowel sounds. No hepatomegaly. No rebound/guarding. No obvious abdominal masses. Msk:  Strength and tone  appear normal for age. Extremities: No clubbing or cyanosis. No edema.  Distal pedal pulses are 2+ and equal bilaterally. Neuro: Alert and oriented X 3. No facial asymmetry. No focal deficit. Moves all extremities spontaneously. Psych:  Responds to questions appropriately with a normal affect.     Assessment and Plan  77 year old female with history of coronary disease status post coronary bypass grafting at an outside hospital years ago per her report.  Records are currently not available.  She also has a history of atrial fibrillation currently on both metoprolol and carvedilol and anticoagulated with Eliquis.  She presented to the emergency room with tingling and dizziness on the right side.  Brain CT and MRI showed no acute problems.  The symptoms have improved.  She was profoundly hypertensive on presentation.  Blood pressure is improved with a dose of nicardipine.  Her serum troponins were mildly elevated on presentation at 0.15 and has remained at this level.  They are not trending up or down.  This is inconsistent with acute coronary syndrome.  Patient had no chest pain.  We will continue to treat her blood pressure.  Would treat her A. fib with metoprolol for now.  Will review echocardiogram when available to determine her LV function.  We will continue with Eliquis.  Patient is on Plavix for unclear reasons.  Will continue with this for now and until records from her prior cardiologist can be obtained.  Not a candidate for invasive evaluation.  This is not an acute coronary syndrome picture.  Signed, Dalia Heading MD 01/15/2018, 7:58 AM Pager: [phone]

## 2018-01-15 NOTE — Plan of Care (Signed)

## 2018-01-15 NOTE — Progress Notes (Signed)
Dr. Amado CoeGouru notified of elevated BP 181/81 pulse 78, pt asymptomatic. Awaiting call back. Per pt takes Clonidine patch 0.1 mg every 7 days.  Update 1750: BP continues to be elevated 172/92 HR 79, Dr. Amado CoeGouru notified received verbal orders for PRN metoprolol 5 mg IV and received verbal orders for Clonidine patch. Will give and continue to monitor.

## 2018-01-15 NOTE — Progress Notes (Signed)
*  PRELIMINARY RESULTS* Echocardiogram 2D Echocardiogram has been performed.  Cristela BlueHege, Aking Klabunde 01/15/2018, 1:40 PM

## 2018-01-15 NOTE — Progress Notes (Signed)
[facility] [place] at [facility]   PATIENT NAME: Valerie Trujillo    MR#:  [id number]  DATE OF BIRTH:  11-20-1940  SUBJECTIVE:  CHIEF COMPLAINT:   Patient is eating lunch denies any chest pain or shortness of breath.  Resting comfortably.  No complaints  REVIEW OF SYSTEMS:  CONSTITUTIONAL: No fever, fatigue or weakness.  EYES: No blurred or double vision.  EARS, NOSE, AND THROAT: No tinnitus or ear pain.  RESPIRATORY: No cough, shortness of breath, wheezing or hemoptysis.  CARDIOVASCULAR: No chest pain, orthopnea, edema.  GASTROINTESTINAL: No nausea, vomiting, diarrhea or abdominal pain.  GENITOURINARY: No dysuria, hematuria.  ENDOCRINE: No polyuria, nocturia,  HEMATOLOGY: No anemia, easy bruising or bleeding SKIN: No rash or lesion. MUSCULOSKELETAL: No joint pain or arthritis.   NEUROLOGIC: No tingling, numbness, weakness.  PSYCHIATRY: No anxiety or depression.   DRUG ALLERGIES:   Allergies  Allergen Reactions  . Aspirin Palpitations    Per Pt: Makes heart flutter.    VITALS:  Blood pressure (!) 159/80, pulse 80, temperature 98.3 F (36.8 C), temperature source Oral, resp. rate 18, height 5\' 5"  (1.651 m), weight 81.7 kg, SpO2 100 %.  PHYSICAL EXAMINATION:  GENERAL:  77 y.o.-year-old patient lying in the bed with no acute distress.  EYES: Pupils equal, round, reactive to light and accommodation. No scleral icterus. Extraocular muscles intact.  HEENT: Head atraumatic, normocephalic. Oropharynx and nasopharynx clear.  NECK:  Supple, no jugular venous distention. No thyroid enlargement, no tenderness.  LUNGS: Normal breath sounds bilaterally, no wheezing, rales,rhonchi or crepitation. No use of accessory muscles of respiration.  CARDIOVASCULAR: S1, S2 normal. No murmurs, rubs, or gallops.  ABDOMEN: Soft, nontender, nondistended. Bowel sounds present.  EXTREMITIES: No pedal edema, cyanosis, or clubbing.  NEUROLOGIC: Awake, alert and oriented x3  sensation intact. Gait not checked.  PSYCHIATRIC: The patient is alert and oriented x 3.  SKIN: No obvious rash, lesion, or ulcer.    LABORATORY PANEL:   CBC Recent Labs  Lab 01/15/18 0653  WBC 5.1  HGB 12.9  HCT 39.7  PLT 133*   ------------------------------------------------------------------------------------------------------------------  Chemistries  Recent Labs  Lab 01/15/18 0653  NA 141  K 3.9  CL 110  CO2 23  GLUCOSE 95  BUN 26*  CREATININE 1.15*  CALCIUM 8.6*   ------------------------------------------------------------------------------------------------------------------  Cardiac Enzymes Recent Labs  Lab 01/15/18 0653  TROPONINI 0.15*   ------------------------------------------------------------------------------------------------------------------  RADIOLOGY:  Ct Head Wo Contrast  Result Date: 01/14/2018 CLINICAL DATA:  Dizziness.  Multiple falls. EXAM: CT HEAD WITHOUT CONTRAST TECHNIQUE: Contiguous axial images were obtained from the base of the skull through the vertex without intravenous contrast. COMPARISON:  None. FINDINGS: Brain: No subdural, epidural, or subarachnoid hemorrhage. Cerebellum, brainstem, and basal cisterns are normal. Ventricles and sulci are mildly prominent but otherwise unremarkable. Mild white matter changes. Lacunar infarct in the right basal ganglia, nonacute appearance. Basal gangliar calcifications are of no significance. No acute cortical ischemia or infarct. No mass effect or midline shift. Vascular: Calcified atherosclerosis in the intracranial carotids. Skull: Previous left craniotomy.  No other calvarial abnormalities. Sinuses/Orbits: The paranasal sinuses are unremarkable. The middle ears are well aerated. There is fluid in inferior posterior right mastoid air cells without identified fracture or adjacent soft tissue swelling. The left mastoid air cells are well aerated as are the middle ears. Other: None. IMPRESSION: 1.  No acute intracranial abnormalities identified. Chronic white matter changes and right basal gangliar lacunar infarct. 2. Fluid in the right mastoid air cells  without bony erosion, fracture, or soft tissue swelling identified. The findings are nonspecific. Electronically Signed   By: Gerome Sam III M.D   On: 01/14/2018 07:56   Mr Brain Wo Contrast  Result Date: 01/14/2018 CLINICAL DATA:  Patient had "a feeling come over her," resulting in an inability to walk. Presyncope. History of stroke. History of atrial fibrillation. EXAM: MRI HEAD WITHOUT CONTRAST TECHNIQUE: Multiplanar, multiecho pulse sequences of the brain and surrounding structures were obtained without intravenous contrast. COMPARISON:  CT head earlier today. FINDINGS: Brain: No evidence for acute infarction, hemorrhage, mass lesion, hydrocephalus, or extra-axial fluid. Moderate cerebral and cerebellar atrophy. Mild to moderate T2 and FLAIR hyperintensities in the white matter, likely small vessel disease. Chronic RIGHT basal ganglia infarct. Vascular: Flow voids are maintained in the carotid, basilar, and vertebral arteries. There is a tiny focus of chronic hemorrhage in the RIGHT frontal operculum, likely ischemic. Slight susceptibility is also seen associated with the prior RIGHT basal ganglia insult. Skull and upper cervical spine: There has been LEFT frontotemporal craniotomy, probably for subdural hematoma evacuation. No residual extra-axial fluid. Partial empty sella with expansion. Small focus of dural ectasia in the anterior aspect of the LEFT middle cranial fossa, with no evidence of brain herniation. Sinuses/Orbits: No paranasal sinus disease. BILATERAL cataract extraction. 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 Other: Trace RIGHT mastoid effusion.  No nasopharyngeal process. IMPRESSION: Atrophy and small vessel disease. Chronic RIGHT basal ganglia infarct. Foci of chronic hemorrhage RIGHT hemisphere, likely ischemic.  Changes of prior LEFT frontotemporal craniotomy, likely for subdural hematoma evacuation. No residual extra-axial fluid. No acute intracranial findings are evident. Electronically Signed   By: Elsie Stain M.D.   On: 01/14/2018 10:42    EKG:   Orders placed or performed during the hospital encounter of 01/14/18  . ED EKG  . ED EKG    ASSESSMENT AND PLAN:     77 year old female patient with history of CVA, hypertension, chronic atrial fibrillation presented to the emergency room for dizziness and elevated blood better controlled  -Hypertensive urgency Dc nicardipine drip Resume oral blood pressure medications, lisinopril dose increased to 40 mg, Toprol-XL, Aldactone and titrate as needed Check echocardiogram  -Dizziness/near syncope Could be secondary from high blood pressure PRN meclizine  -History of CVA Resume Plavix and statin medication CT head and MRI brain showed no acute abnormality  -Chronic atrial fibrillation  resume Eliquis for anticoagulation   -Abnormal troponin-acute MI ruled out No complaints of chest pain EKG no ST segment elevation Cycle troponin -0.15-0.16-0.16-0.15, inconsistent with acute coronary syndrome cardiology consultation seen by Dr. Lady Gary recommending echocardiogram and no other invasive interventions from cardiac standpoint at this time Beta-blocker and statin medication to continue Continue Plavix    All the records are reviewed and case discussed with Care Management/Social Workerr. Management plans discussed with the patient, family and they are in agreement.  CODE STATUS: fc   TOTAL TIME TAKING CARE OF THIS PATIENT: 35  minutes.   POSSIBLE D/C IN 1-2 DAYS, DEPENDING ON CLINICAL CONDITION.  Note: This dictation was prepared with Dragon dictation along with smaller phrase technology. Any transcriptional errors that result from this process are unintentional.   Ramonita Lab M.D on 01/15/2018 at 1:42 PM  Between 7am to 6pm -  Pager - [phone] After 6pm go to [email or web address] - password [email or web address] [facility]  [facility]  Office  [phone]  CC: Primary care physician; Woodroe Chen, MD

## 2018-01-16 LAB — ECHOCARDIOGRAM COMPLETE
Height: 65 in
Weight: 2881.6 oz

## 2018-01-16 MED ORDER — CARVEDILOL 25 MG PO TABS
25.0000 mg | ORAL_TABLET | Freq: Two times a day (BID) | ORAL | Status: DC
  Administered 2018-01-16: 25 mg via ORAL
  Filled 2018-01-16 (×2): qty 1

## 2018-01-16 MED ORDER — CLONIDINE HCL 0.2 MG/24HR TD PTWK: 0.2000 mg | MEDICATED_PATCH | TRANSDERMAL | Status: DC

## 2018-01-16 NOTE — Plan of Care (Signed)
  Problem: Clinical Measurements: Goal: Ability to maintain clinical measurements within normal limits will improve 01/16/2018 0314 by Myles GipKimrey, Maresha Anastos M, RN Outcome: Progressing Note:  Pt BP 155/90 HR 78 01/16/2018 0307 by Myles GipKimrey, Janie Strothman M, RN Outcome: Progressing Note:  BP was at 155/90 HR 78   Problem: Safety: Goal: Ability to remain free from injury will improve Outcome: Progressing

## 2018-01-16 NOTE — Progress Notes (Signed)
Patient ambulated in hall with staff, GB, and wheeled walker.  Ambualted approx. 13540ft.  Tolerated well.  Per NT, stated that she felt a little dizzy at first but then much better as she moved.  Back in bed a this time eating lunch.  Call bell in reach.

## 2018-01-16 NOTE — Progress Notes (Signed)
[facility] [place] [facility]   PATIENT NAME: Valerie Schlichterlene Dumler    MR#:  [id number]  DATE OF BIRTH:  [birth year]  SUBJECTIVE:  CHIEF COMPLAINT:   Patient is resting comfortably.  No complaints Denies any headache, chest pain or shortness of breath.  Denies any dizziness.  Patient's clonidine patch which was discontinued was resumed  REVIEW OF SYSTEMS:  CONSTITUTIONAL: No fever, fatigue or weakness.  EYES: No blurred or double vision.  EARS, NOSE, AND THROAT: No tinnitus or ear pain.  RESPIRATORY: No cough, shortness of breath, wheezing or hemoptysis.  CARDIOVASCULAR: No chest pain, orthopnea, edema.  GASTROINTESTINAL: No nausea, vomiting, diarrhea or abdominal pain.  GENITOURINARY: No dysuria, hematuria.  ENDOCRINE: No polyuria, nocturia,  HEMATOLOGY: No anemia, easy bruising or bleeding SKIN: No rash or lesion. MUSCULOSKELETAL: No joint pain or arthritis.   NEUROLOGIC: No tingling, numbness, weakness.  PSYCHIATRY: No anxiety or depression.   DRUG ALLERGIES:   Allergies  Allergen Reactions  . Aspirin Palpitations    Per Pt: Makes heart flutter.    VITALS:  Blood pressure (!) 175/83, pulse 77, temperature 98.1 F (36.7 C), temperature source Oral, resp. rate 20, height 5\' 5"  (1.651 m), weight 81.2 kg, SpO2 96 %.  PHYSICAL EXAMINATION:  GENERAL:  77 y.o.-year-old patient lying in the bed with no acute distress.  EYES: Pupils equal, round, reactive to light and accommodation. No scleral icterus. Extraocular muscles intact.  HEENT: Head atraumatic, normocephalic. Oropharynx and nasopharynx clear.  NECK:  Supple, no jugular venous distention. No thyroid enlargement, no tenderness.  LUNGS: Normal breath sounds bilaterally, no wheezing, rales,rhonchi or crepitation. No use of accessory muscles of respiration.  CARDIOVASCULAR: S1, S2 normal. No murmurs, rubs, or gallops.  ABDOMEN: Soft, nontender, nondistended. Bowel sounds present.  EXTREMITIES: No pedal  edema, cyanosis, or clubbing.  NEUROLOGIC: Awake, alert and oriented x3 sensation intact. Gait not checked.  PSYCHIATRIC: The patient is alert and oriented x 3.  SKIN: No obvious rash, lesion, or ulcer.    LABORATORY PANEL:   CBC Recent Labs  Lab 01/15/18 0653  WBC 5.1  HGB 12.9  HCT 39.7  PLT 133*   ------------------------------------------------------------------------------------------------------------------  Chemistries  Recent Labs  Lab 01/15/18 0653  NA 141  K 3.9  CL 110  CO2 23  GLUCOSE 95  BUN 26*  CREATININE 1.15*  CALCIUM 8.6*   ------------------------------------------------------------------------------------------------------------------  Cardiac Enzymes Recent Labs  Lab 01/15/18 0653  TROPONINI 0.15*   ------------------------------------------------------------------------------------------------------------------  RADIOLOGY:  No results found.  EKG:   Orders placed or performed during the hospital encounter of 01/14/18  . ED EKG  . ED EKG    ASSESSMENT AND PLAN:     77 year old female patient with history of CVA, hypertension, chronic atrial fibrillation presented to the emergency room for dizziness and elevated blood better controlled  -Hypertensive urgency-probably rebound hypertension Dc nicardipine drip Resume oral blood pressure medications, lisinopril dose increased to 40 mg, Coreg dose increased to 25 mg Aldactone and titrate as needed Clonidine patch resumed echocardiogram with 55 to 65% ejection fraction.  Moderate to severe aortic stenosis.  Mild mitral regurgitation.  Systolic function was normal.  -Dizziness/near syncope Could be secondary from high blood pressure PRN meclizine  -History of CVA Resume Plavix and statin medication CT head and MRI brain showed no acute abnormality  -Chronic atrial fibrillation  resume Eliquis for anticoagulation   -Abnormal troponin-acute MI ruled out No complaints of chest  pain EKG no ST segment elevation Cycle troponin -0.15-0.16-0.16-0.15,  inconsistent with acute coronary syndrome cardiology consultation seen by Dr. Lady Gary echocardiogramdone and no other invasive interventions from cardiac standpoint at this time Beta-blocker and statin medication to continue Check fasting lipid panel Continue Plavix Will check with Dr. Lady Gary as patient is both on Toprol and Coreg-2 beta-blockers    All the records are reviewed and case discussed with Care Management/Social Workerr. Management plans discussed with the patient, family and they are in agreement.  CODE STATUS: fc   TOTAL TIME TAKING CARE OF THIS PATIENT: 35  minutes.   POSSIBLE D/C IN 1-DAYS, DEPENDING ON CLINICAL CONDITION.  Note: This dictation was prepared with Dragon dictation along with smaller phrase technology. Any transcriptional errors that result from this process are unintentional.   Ramonita Lab M.D on 01/16/2018 at 2:04 PM  Between 7am to 6pm - Pager - [phone] After 6pm go to [email or web address] - password [email or web address] [facility]  [facility]  Office  [phone]  CC: Primary care physician; Woodroe Chen, MD

## 2018-01-17 LAB — LIPID PANEL
Cholesterol: 164 mg/dL (ref 0–200)
HDL: 44 mg/dL (ref >40)
LDL Cholesterol: 106 mg/dL — ABNORMAL HIGH (ref 0–99)
Total CHOL/HDL Ratio: 3.7 RATIO
Triglycerides: 70 mg/dL (ref <150)
VLDL: 14 mg/dL (ref 0–40)

## 2018-01-17 MED ORDER — CARVEDILOL 25 MG PO TABS
25.0000 mg | ORAL_TABLET | Freq: Two times a day (BID) | ORAL | Status: DC
  Administered 2018-01-17: 25 mg via ORAL

## 2018-01-17 MED ORDER — ATORVASTATIN CALCIUM 40 MG PO TABS: 40.0000 mg | ORAL_TABLET | Freq: Every day | ORAL | 0 refills | Status: DC

## 2018-01-17 MED ORDER — LISINOPRIL 40 MG PO TABS: 40.0000 mg | ORAL_TABLET | Freq: Every day | ORAL | 0 refills | Status: DC

## 2018-01-17 MED ORDER — ACETAMINOPHEN 325 MG PO TABS: 650.0000 mg | ORAL_TABLET | Freq: Four times a day (QID) | ORAL | Status: AC | PRN

## 2018-01-17 MED ORDER — SENNOSIDES-DOCUSATE SODIUM 8.6-50 MG PO TABS: 1.0000 | ORAL_TABLET | Freq: Every evening | ORAL | Status: AC | PRN

## 2018-01-17 MED ORDER — CLONIDINE 0.2 MG/24HR TD PTWK: 0.2000 mg | MEDICATED_PATCH | TRANSDERMAL | 1 refills | Status: DC

## 2018-01-17 MED ORDER — CARVEDILOL 25 MG PO TABS: 25.0000 mg | ORAL_TABLET | Freq: Two times a day (BID) | ORAL | 0 refills | Status: DC

## 2018-01-17 NOTE — Care Management Important Message (Signed)
Copy of signed IM left with patient in room.  

## 2018-01-17 NOTE — Discharge Instructions (Signed)
Follow-up with primary care physician in 3 to 5 days Follow-up with cardiology Dr. Lady GaryFath in a week

## 2018-01-17 NOTE — Progress Notes (Signed)
Patient discharged to home wit family.  Tele and IV d/c'd prior to discharge.  Patient and niece verbalize understanding of discharge instructions.

## 2018-01-17 NOTE — Progress Notes (Signed)
Patient Name: Valerie Trujillo Date of Encounter: 01/17/2018  Hospital Problem List     Active Problems:   Hypertensive emergency   Uncontrolled hypertension    Patient Profile     Pt admitted with hypertensive crisis. Blood pressure improved this am 141/67  Subjective   Feels better.   Inpatient Medications    . apixaban  2.5 mg Oral BID  . carvedilol  25 mg Oral BID WC  . [START ON 01/22/2018] cloNIDine  0.2 mg Transdermal Weekly  . clopidogrel  75 mg Oral Daily  . hydrochlorothiazide  12.5 mg Oral Daily  . lisinopril  40 mg Oral Daily  . mirtazapine  15 mg Oral QHS  . sodium chloride flush  3 mL Intravenous Q12H  . spironolactone  25 mg Oral Daily    Vital Signs    Vitals:   01/16/18 1939 01/17/18 0439 01/17/18 0500 01/17/18 0736  BP: 137/65 (!) 153/71  (!) 141/67  Pulse: 76 78  75  Resp: (!) 22 20    Temp: 99.3 F (37.4 C) (!) 97.5 F (36.4 C)  97.6 F (36.4 C)  TempSrc:    Oral  SpO2: 96% 99%  98%  Weight:   80.5 kg   Height:        Intake/Output Summary (Last 24 hours) at 01/17/2018 0804 Last data filed at 01/16/2018 2040 Gross per 24 hour  Intake 600 ml  Output 200 ml  Net 400 ml   Filed Weights   01/14/18 1607 01/16/18 0540 01/17/18 0500  Weight: 81.7 kg 81.2 kg 80.5 kg    Physical Exam    GEN: Well nourished, well developed, in no acute distress.  HEENT: normal.  Neck: Supple, no JVD, carotid bruits, or masses. Cardiac: RRR, no murmurs, rubs, or gallops. No clubbing, cyanosis, edema.  Radials/DP/PT 2+ and equal bilaterally.  Respiratory:  Respirations regular and unlabored, clear to auscultation bilaterally. GI: Soft, nontender, nondistended, BS + x 4. MS: no deformity or atrophy. Skin: warm and dry, no rash. Neuro:  Strength and sensation are intact. Psych: Normal affect.  Labs    CBC Recent Labs    01/15/18 0653  WBC 5.1  HGB 12.9  HCT 39.7  MCV 95.7  PLT 133*   Basic Metabolic Panel Recent Labs    16/10/96 0653  NA  141  K 3.9  CL 110  CO2 23  GLUCOSE 95  BUN 26*  CREATININE 1.15*  CALCIUM 8.6*   Liver Function Tests No results for input(s): AST, ALT, ALKPHOS, BILITOT, PROT, ALBUMIN in the last 72 hours. No results for input(s): LIPASE, AMYLASE in the last 72 hours. Cardiac Enzymes Recent Labs    01/14/18 1614 01/14/18 2133 01/15/18 0653  TROPONINI 0.16* 0.16* 0.15*   BNP No results for input(s): BNP in the last 72 hours. D-Dimer No results for input(s): DDIMER in the last 72 hours. Hemoglobin A1C No results for input(s): HGBA1C in the last 72 hours. Fasting Lipid Panel Recent Labs    01/17/18 0439  CHOL 164  HDL 44  LDLCALC 106*  TRIG 70  CHOLHDL 3.7   Thyroid Function Tests No results for input(s): TSH, T4TOTAL, T3FREE, THYROIDAB in the last 72 hours.  Invalid input(s): FREET3  Telemetry    afib   ECG    afib with variable vr   Radiology    Ct Head Wo Contrast  Result Date: 01/14/2018 CLINICAL DATA:  Dizziness.  Multiple falls. EXAM: CT HEAD WITHOUT CONTRAST TECHNIQUE: Contiguous axial images  were obtained from the base of the skull through the vertex without intravenous contrast. COMPARISON:  None. FINDINGS: Brain: No subdural, epidural, or subarachnoid hemorrhage. Cerebellum, brainstem, and basal cisterns are normal. Ventricles and sulci are mildly prominent but otherwise unremarkable. Mild white matter changes. Lacunar infarct in the right basal ganglia, nonacute appearance. Basal gangliar calcifications are of no significance. No acute cortical ischemia or infarct. No mass effect or midline shift. Vascular: Calcified atherosclerosis in the intracranial carotids. Skull: Previous left craniotomy.  No other calvarial abnormalities. Sinuses/Orbits: The paranasal sinuses are unremarkable. The middle ears are well aerated. There is fluid in inferior posterior right mastoid air cells without identified fracture or adjacent soft tissue swelling. The left mastoid air cells  are well aerated as are the middle ears. Other: None. IMPRESSION: 1. No acute intracranial abnormalities identified. Chronic white matter changes and right basal gangliar lacunar infarct. 2. Fluid in the right mastoid air cells without bony erosion, fracture, or soft tissue swelling identified. The findings are nonspecific. Electronically Signed   By: Gerome Samavid  Williams III M.D   On: 01/14/2018 07:56   Mr Brain Wo Contrast  Result Date: 01/14/2018 CLINICAL DATA:  Patient had "a feeling come over her," resulting in an inability to walk. Presyncope. History of stroke. History of atrial fibrillation. EXAM: MRI HEAD WITHOUT CONTRAST TECHNIQUE: Multiplanar, multiecho pulse sequences of the brain and surrounding structures were obtained without intravenous contrast. COMPARISON:  CT head earlier today. FINDINGS: Brain: No evidence for acute infarction, hemorrhage, mass lesion, hydrocephalus, or extra-axial fluid. Moderate cerebral and cerebellar atrophy. Mild to moderate T2 and FLAIR hyperintensities in the white matter, likely small vessel disease. Chronic RIGHT basal ganglia infarct. Vascular: Flow voids are maintained in the carotid, basilar, and vertebral arteries. There is a tiny focus of chronic hemorrhage in the RIGHT frontal operculum, likely ischemic. Slight susceptibility is also seen associated with the prior RIGHT basal ganglia insult. Skull and upper cervical spine: There has been LEFT frontotemporal craniotomy, probably for subdural hematoma evacuation. No residual extra-axial fluid. Partial empty sella with expansion. Small focus of dural ectasia in the anterior aspect of the LEFT middle cranial fossa, with no evidence of brain herniation. Sinuses/Orbits: No paranasal sinus disease. BILATERAL cataract extraction. 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 Other: Trace RIGHT mastoid effusion.  No nasopharyngeal process. IMPRESSION: Atrophy and small vessel disease. Chronic RIGHT basal ganglia  infarct. Foci of chronic hemorrhage RIGHT hemisphere, likely ischemic. Changes of prior LEFT frontotemporal craniotomy, likely for subdural hematoma evacuation. No residual extra-axial fluid. No acute intracranial findings are evident. Electronically Signed   By: Elsie StainJohn T Curnes M.D.   On: 01/14/2018 10:42    Assessment & Plan    Hypertension-pressure improved on current meds. Continue with lisinopril, clonidine 0.2 / 24 hours, carvedilol 25 bid, and spironolactone 25 daily.   afib-rate controlled with carvedilol at present. Continue with apixaban.   Hx of cva-continue careful use of plavix. No asa.       Signed, Darlin PriestlyKenneth A. Chidera Dearcos MD 01/17/2018, 8:04 AM  Pager: [phone]

## 2018-01-17 NOTE — Discharge Summary (Signed)
[facility] - [place] at [facility]   PATIENT NAME: Valerie Trujillo    MR#:  [id number]  DATE OF BIRTH:  1940-08-02  DATE OF ADMISSION:  01/14/2018 ADMITTING PHYSICIAN: Ihor Austin, MD  DATE OF DISCHARGE: 01/17/18   PRIMARY CARE PHYSICIAN: Woodroe Chen, MD    ADMISSION DIAGNOSIS:  TIA (transient ischemic attack) [G45.9] Vertigo [R42] NSTEMI (non-ST elevated myocardial infarction) (HCC) [I21.4] AKI (acute kidney injury) (HCC) [N17.9] Uncontrolled hypertension [I10]  DISCHARGE DIAGNOSIS:  Active Problems:   Hypertensive emergency   Uncontrolled hypertension   SECONDARY DIAGNOSIS:   Past Medical History:  Diagnosis Date  . A-fib (HCC)   . Stroke [facility])     HOSPITAL COURSE:   Hypertensive urgency-probably rebound hypertension Dced  nicardipine drip Resume oral blood pressure medications, lisinopril dose increased to 40 mg, Coreg dose increased to 25 mg Aldactone and titrate as needed Clonidine patch resumed dose increased to 0.2  Discontinued metoprolol echocardiogram with 55 to 65% ejection fraction.  Moderate to severe aortic stenosis.  Mild mitral regurgitation.  Systolic function was normal.  -Dizziness/near syncope Could be secondary from high blood pressure, resolved now PRN meclizine  -History of CVA Resume Plavix and statin medication CT head and MRI brain showed no acute abnormality  -Chronic atrial fibrillation  resume Eliquis for anticoagulation   -Abnormal troponin-acute MI ruled out No complaints of chest pain EKG no ST segment elevation Cycle troponin -0.15-0.16-0.16-0.15, inconsistent with acute coronary syndrome cardiology consultation seen by Dr. Lady Gary echocardiogramdone and no other invasive interventions from cardiac standpoint at this time Beta-blocker Coreg and statin medication to continue  fasting lipid panel-LDL 106 Continue Plavix, discontinue aspirin as recommended by Dr. Lady Gary Outpatient follow-up  with Dr. Lady Gary  -Generalized weakness Patient refused home health PT as her niece who is a retired PT can help her   DISCHARGE CONDITIONS:   stable  CONSULTS OBTAINED:  Treatment Team:  Dalia Heading, MD   PROCEDURES  NONE   DRUG ALLERGIES:   Allergies  Allergen Reactions  . Aspirin Palpitations    Per Pt: Makes heart flutter.    DISCHARGE MEDICATIONS:   Allergies as of 01/17/2018      Reactions   Aspirin Palpitations   Per Pt: Makes heart flutter.      Medication List    STOP taking these medications   metoprolol succinate 100 MG 24 hr tablet Commonly known as:  TOPROL-XL     TAKE these medications   acetaminophen 325 MG tablet Commonly known as:  TYLENOL Take 2 tablets (650 mg total) by mouth every 6 (six) hours as needed for mild pain (or Fever >/= 101).   atorvastatin 40 MG tablet Commonly known as:  LIPITOR Take 1 tablet (40 mg total) by mouth daily.   carvedilol 25 MG tablet Commonly known as:  COREG Take 1 tablet (25 mg total) by mouth 2 (two) times daily with a meal. What changed:    medication strength  how much to take  when to take this   cloNIDine 0.2 mg/24hr patch Commonly known as:  CATAPRES - Dosed in mg/24 hr Place 1 patch (0.2 mg total) onto the skin once a week. Start taking on:  January 22, 2018   Coenzyme Q10 100 MG capsule Take 1 capsule by mouth daily.   ELIQUIS 2.5 MG Tabs tablet Generic drug:  apixaban Take 1 tablet by mouth 2 (two) times daily.   lisinopril 40 MG tablet Commonly known as:  PRINIVIL,ZESTRIL Take 1  tablet (40 mg total) by mouth daily. Start taking on:  January 18, 2018 What changed:    medication strength  how much to take   mirtazapine 15 MG tablet Commonly known as:  REMERON Take 1 tablet by mouth at bedtime.   PLAVIX 75 MG tablet Generic drug:  clopidogrel Take 1 tablet by mouth daily.   senna-docusate 8.6-50 MG tablet Commonly known as:  Senokot-S Take 1 tablet by mouth at  bedtime as needed for mild constipation.   spironolactone 25 MG tablet Commonly known as:  ALDACTONE Take 1 tablet by mouth daily.        DISCHARGE INSTRUCTIONS:  Follow-up with primary care physician in 3 to 5 days Follow-up with cardiology Dr. Lady GaryFath in a week   DIET:  Cardiac diet  DISCHARGE CONDITION:  Fair  ACTIVITY:  Activity as tolerated  OXYGEN:  Home Oxygen: No.   Oxygen Delivery: room air  DISCHARGE LOCATION:  home   If you experience worsening of your admission symptoms, develop shortness of breath, life threatening emergency, suicidal or homicidal thoughts you must seek medical attention immediately by calling 911 or calling your MD immediately  if symptoms less severe.  You Must read complete instructions/literature along with all the possible adverse reactions/side effects for all the Medicines you take and that have been prescribed to you. Take any new Medicines after you have completely understood and accpet all the possible adverse reactions/side effects.   Please note  You were cared for by a hospitalist during your hospital stay. If you have any questions about your discharge medications or the care you received while you were in the hospital after you are discharged, you can call the unit and asked to speak with the hospitalist on call if the hospitalist that took care of you is not available. Once you are discharged, your primary care physician will handle any further medical issues. Please note that NO REFILLS for any discharge medications will be authorized once you are discharged, as it is imperative that you return to your primary care physician (or establish a relationship with a primary care physician if you do not have one) for your aftercare needs so that they can reassess your need for medications and monitor your lab values.     Today  Chief Complaint  Patient presents with  . Dizziness   Patient is reporting generalized weakness denies any  dizziness.  Refused PT assessment and home health physical therapy as patient's niece can help her with home PT.  Okay to discharge patient from cardiology standpoint with no other interventions  ROS:  CONSTITUTIONAL: Denies fevers, chills. Denies any fatigue, weakness.  EYES: Denies blurry vision, double vision, eye pain. EARS, NOSE, THROAT: Denies tinnitus, ear pain, hearing loss. RESPIRATORY: Denies cough, wheeze, shortness of breath.  CARDIOVASCULAR: Denies chest pain, palpitations, edema.  GASTROINTESTINAL: Denies nausea, vomiting, diarrhea, abdominal pain. Denies bright red blood per rectum. GENITOURINARY: Denies dysuria, hematuria. ENDOCRINE: Denies nocturia or thyroid problems. HEMATOLOGIC AND LYMPHATIC: Denies easy bruising or bleeding. SKIN: Denies rash or lesion. MUSCULOSKELETAL: Denies pain in neck, back, shoulder, knees, hips or arthritic symptoms.  NEUROLOGIC: Denies paralysis, paresthesias.  PSYCHIATRIC: Denies anxiety or depressive symptoms.   VITAL SIGNS:  Blood pressure (!) 141/67, pulse 75, temperature 97.6 F (36.4 C), temperature source Oral, resp. rate 20, height 5\' 5"  (1.651 m), weight 80.5 kg, SpO2 98 %.  I/O:    Intake/Output Summary (Last 24 hours) at 01/17/2018 1219 Last data filed at 01/17/2018 1024 Gross  per 24 hour  Intake 720 ml  Output 200 ml  Net 520 ml    PHYSICAL EXAMINATION:  GENERAL:  77 y.o.-year-old patient lying in the bed with no acute distress.  EYES: Pupils equal, round, reactive to light and accommodation. No scleral icterus. Extraocular muscles intact.  HEENT: Head atraumatic, normocephalic. Oropharynx and nasopharynx clear.  NECK:  Supple, no jugular venous distention. No thyroid enlargement, no tenderness.  LUNGS: Normal breath sounds bilaterally, no wheezing, rales,rhonchi or crepitation. No use of accessory muscles of respiration.  CARDIOVASCULAR: S1, S2 normal. No murmurs, rubs, or gallops.  ABDOMEN: Soft, non-tender,  non-distended. Bowel sounds present. No organomegaly or mass.  EXTREMITIES: No pedal edema, cyanosis, or clubbing.  NEUROLOGIC: Cranial nerves II through XII are intact. Muscle strength 5/5 in all extremities. Sensation intact. Gait not checked.  PSYCHIATRIC: The patient is alert and oriented x 3.  SKIN: No obvious rash, lesion, or ulcer.   DATA REVIEW:   CBC Recent Labs  Lab 01/15/18 0653  WBC 5.1  HGB 12.9  HCT 39.7  PLT 133*    Chemistries  Recent Labs  Lab 01/15/18 0653  NA 141  K 3.9  CL 110  CO2 23  GLUCOSE 95  BUN 26*  CREATININE 1.15*  CALCIUM 8.6*    Cardiac Enzymes Recent Labs  Lab 01/15/18 0653  TROPONINI 0.15*    Microbiology Results  No results found for this or any previous visit.  RADIOLOGY:  Ct Head Wo Contrast  Result Date: 01/14/2018 CLINICAL DATA:  Dizziness.  Multiple falls. EXAM: CT HEAD WITHOUT CONTRAST TECHNIQUE: Contiguous axial images were obtained from the base of the skull through the vertex without intravenous contrast. COMPARISON:  None. FINDINGS: Brain: No subdural, epidural, or subarachnoid hemorrhage. Cerebellum, brainstem, and basal cisterns are normal. Ventricles and sulci are mildly prominent but otherwise unremarkable. Mild white matter changes. Lacunar infarct in the right basal ganglia, nonacute appearance. Basal gangliar calcifications are of no significance. No acute cortical ischemia or infarct. No mass effect or midline shift. Vascular: Calcified atherosclerosis in the intracranial carotids. Skull: Previous left craniotomy.  No other calvarial abnormalities. Sinuses/Orbits: The paranasal sinuses are unremarkable. The middle ears are well aerated. There is fluid in inferior posterior right mastoid air cells without identified fracture or adjacent soft tissue swelling. The left mastoid air cells are well aerated as are the middle ears. Other: None. IMPRESSION: 1. No acute intracranial abnormalities identified. Chronic white matter  changes and right basal gangliar lacunar infarct. 2. Fluid in the right mastoid air cells without bony erosion, fracture, or soft tissue swelling identified. The findings are nonspecific. Electronically Signed   By: Gerome Sam III M.D   On: 01/14/2018 07:56   Mr Brain Wo Contrast  Result Date: 01/14/2018 CLINICAL DATA:  Patient had "a feeling come over her," resulting in an inability to walk. Presyncope. History of stroke. History of atrial fibrillation. EXAM: MRI HEAD WITHOUT CONTRAST TECHNIQUE: Multiplanar, multiecho pulse sequences of the brain and surrounding structures were obtained without intravenous contrast. COMPARISON:  CT head earlier today. FINDINGS: Brain: No evidence for acute infarction, hemorrhage, mass lesion, hydrocephalus, or extra-axial fluid. Moderate cerebral and cerebellar atrophy. Mild to moderate T2 and FLAIR hyperintensities in the white matter, likely small vessel disease. Chronic RIGHT basal ganglia infarct. Vascular: Flow voids are maintained in the carotid, basilar, and vertebral arteries. There is a tiny focus of chronic hemorrhage in the RIGHT frontal operculum, likely ischemic. Slight susceptibility is also seen associated with the prior RIGHT  basal ganglia insult. Skull and upper cervical spine: There has been LEFT frontotemporal craniotomy, probably for subdural hematoma evacuation. No residual extra-axial fluid. Partial empty sella with expansion. Small focus of dural ectasia in the anterior aspect of the LEFT middle cranial fossa, with no evidence of brain herniation. Sinuses/Orbits: No paranasal sinus disease. BILATERAL cataract extraction. 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 Other: Trace RIGHT mastoid effusion.  No nasopharyngeal process. IMPRESSION: Atrophy and small vessel disease. Chronic RIGHT basal ganglia infarct. Foci of chronic hemorrhage RIGHT hemisphere, likely ischemic. Changes of prior LEFT frontotemporal craniotomy, likely for subdural  hematoma evacuation. No residual extra-axial fluid. No acute intracranial findings are evident. Electronically Signed   By: Elsie Stain M.D.   On: 01/14/2018 10:42    EKG:   Orders placed or performed during the hospital encounter of 01/14/18  . ED EKG  . ED EKG      Management plans discussed with the patient, family and they are in agreement.  CODE STATUS:     Code Status Orders  (From admission, onward)         Start     Ordered   01/14/18 1554  Full code  Continuous     01/14/18 1553        Code Status History    This patient has a current code status but no historical code status.      TOTAL TIME TAKING CARE OF THIS PATIENT: 45  minutes.   Note: This dictation was prepared with Dragon dictation along with smaller phrase technology. Any transcriptional errors that result from this process are unintentional.   @M[facility] @  on 01/17/2018 at 12:19 PM  Between 7am to 6pm - Pager - [phone]  After 6pm go to [email or web address] - password [email or web address]  [facility] [place] [facility]  Office  [phone]  CC: Primary care physician; Woodroe Chen, MD

## 2018-01-17 NOTE — Plan of Care (Signed)
  Problem: Education: Goal: Knowledge of General Education information will improve Description: Including pain rating scale, medication(s)/side effects and non-pharmacologic comfort measures Outcome: Progressing   Problem: Health Behavior/Discharge Planning: Goal: Ability to manage health-related needs will improve Outcome: Progressing   Problem: Safety: Goal: Ability to remain free from injury will improve Outcome: Progressing   

## 2018-01-22 ENCOUNTER — Telehealth: Payer: Self-pay

## 2018-01-22 NOTE — Telephone Encounter (Signed)
Flagged on EMMI report for not having a follow up scheduled.  Called and spoke with patient.  She expressed frustration that she has received many automated calls today.  States she has completed call, but continues to get calls.  I apologized that this was occurring and thanked her for the feedback.  Explained she was getting the second follow up EMMI call that would try to reach her up to three times today.  As for follow-up appointments, she has called to make one with her PCP, still waiting to schedule one with Dr. Lady GaryFath.  No other issues or concerns at this time.

## 2019-04-12 ENCOUNTER — Other Ambulatory Visit: Payer: Self-pay

## 2019-04-12 ENCOUNTER — Inpatient Hospital Stay
Admission: EM | Admit: 2019-04-12 | Discharge: 2019-04-17 | DRG: 280 | Disposition: A | Discharge status: Skilled Nursing Facility | Payer: Medicare Other | Attending: Internal Medicine | Admitting: Internal Medicine

## 2019-04-12 ENCOUNTER — Emergency Department: Payer: Medicare Other

## 2019-04-12 ENCOUNTER — Encounter: Payer: Self-pay | Admitting: Emergency Medicine

## 2019-04-12 DIAGNOSIS — J9601 Acute respiratory failure with hypoxia: Secondary | ICD-10-CM | POA: Diagnosis present

## 2019-04-12 DIAGNOSIS — N3281 Overactive bladder: Secondary | ICD-10-CM | POA: Diagnosis present

## 2019-04-12 DIAGNOSIS — I350 Nonrheumatic aortic (valve) stenosis: Secondary | ICD-10-CM

## 2019-04-12 DIAGNOSIS — I110 Hypertensive heart disease with heart failure: Secondary | ICD-10-CM | POA: Diagnosis present

## 2019-04-12 DIAGNOSIS — J9621 Acute and chronic respiratory failure with hypoxia: Secondary | ICD-10-CM | POA: Diagnosis not present

## 2019-04-12 DIAGNOSIS — E876 Hypokalemia: Secondary | ICD-10-CM | POA: Diagnosis present

## 2019-04-12 DIAGNOSIS — Z886 Allergy status to analgesic agent status: Secondary | ICD-10-CM

## 2019-04-12 DIAGNOSIS — I5033 Acute on chronic diastolic (congestive) heart failure: Secondary | ICD-10-CM | POA: Diagnosis present

## 2019-04-12 DIAGNOSIS — I509 Heart failure, unspecified: Secondary | ICD-10-CM | POA: Diagnosis not present

## 2019-04-12 DIAGNOSIS — I214 Non-ST elevation (NSTEMI) myocardial infarction: Principal | ICD-10-CM

## 2019-04-12 DIAGNOSIS — R0602 Shortness of breath: Secondary | ICD-10-CM

## 2019-04-12 DIAGNOSIS — Z8249 Family history of ischemic heart disease and other diseases of the circulatory system: Secondary | ICD-10-CM

## 2019-04-12 DIAGNOSIS — F0390 Unspecified dementia without behavioral disturbance: Secondary | ICD-10-CM | POA: Diagnosis present

## 2019-04-12 DIAGNOSIS — K5900 Constipation, unspecified: Secondary | ICD-10-CM | POA: Diagnosis present

## 2019-04-12 DIAGNOSIS — J9600 Acute respiratory failure, unspecified whether with hypoxia or hypercapnia: Secondary | ICD-10-CM | POA: Diagnosis present

## 2019-04-12 DIAGNOSIS — Z951 Presence of aortocoronary bypass graft: Secondary | ICD-10-CM | POA: Diagnosis not present

## 2019-04-12 DIAGNOSIS — Z7901 Long term (current) use of anticoagulants: Secondary | ICD-10-CM

## 2019-04-12 DIAGNOSIS — I25810 Atherosclerosis of coronary artery bypass graft(s) without angina pectoris: Secondary | ICD-10-CM | POA: Diagnosis present

## 2019-04-12 DIAGNOSIS — R310 Gross hematuria: Secondary | ICD-10-CM | POA: Diagnosis present

## 2019-04-12 DIAGNOSIS — I739 Peripheral vascular disease, unspecified: Secondary | ICD-10-CM | POA: Diagnosis present

## 2019-04-12 DIAGNOSIS — R609 Edema, unspecified: Secondary | ICD-10-CM

## 2019-04-12 DIAGNOSIS — Z6835 Body mass index (BMI) 35.0-35.9, adult: Secondary | ICD-10-CM

## 2019-04-12 DIAGNOSIS — Z8673 Personal history of transient ischemic attack (TIA), and cerebral infarction without residual deficits: Secondary | ICD-10-CM

## 2019-04-12 DIAGNOSIS — I447 Left bundle-branch block, unspecified: Secondary | ICD-10-CM | POA: Diagnosis present

## 2019-04-12 DIAGNOSIS — I959 Hypotension, unspecified: Secondary | ICD-10-CM | POA: Diagnosis not present

## 2019-04-12 DIAGNOSIS — R109 Unspecified abdominal pain: Secondary | ICD-10-CM

## 2019-04-12 DIAGNOSIS — Z87891 Personal history of nicotine dependence: Secondary | ICD-10-CM

## 2019-04-12 DIAGNOSIS — I482 Chronic atrial fibrillation, unspecified: Secondary | ICD-10-CM | POA: Diagnosis present

## 2019-04-12 DIAGNOSIS — Z20822 Contact with and (suspected) exposure to covid-19: Secondary | ICD-10-CM | POA: Diagnosis present

## 2019-04-12 DIAGNOSIS — E785 Hyperlipidemia, unspecified: Secondary | ICD-10-CM | POA: Diagnosis present

## 2019-04-12 DIAGNOSIS — I2510 Atherosclerotic heart disease of native coronary artery without angina pectoris: Secondary | ICD-10-CM | POA: Diagnosis present

## 2019-04-12 DIAGNOSIS — Z7902 Long term (current) use of antithrombotics/antiplatelets: Secondary | ICD-10-CM

## 2019-04-12 DIAGNOSIS — Z79899 Other long term (current) drug therapy: Secondary | ICD-10-CM

## 2019-04-12 DIAGNOSIS — F419 Anxiety disorder, unspecified: Secondary | ICD-10-CM | POA: Diagnosis present

## 2019-04-12 DIAGNOSIS — N179 Acute kidney failure, unspecified: Secondary | ICD-10-CM | POA: Diagnosis present

## 2019-04-12 DIAGNOSIS — Z833 Family history of diabetes mellitus: Secondary | ICD-10-CM

## 2019-04-12 DIAGNOSIS — D649 Anemia, unspecified: Secondary | ICD-10-CM | POA: Diagnosis present

## 2019-04-12 LAB — MAGNESIUM: Magnesium: 1.8 mg/dL (ref 1.7–2.4)

## 2019-04-12 LAB — GLUCOSE, CAPILLARY: Glucose-Capillary: 113 mg/dL — ABNORMAL HIGH (ref 70–99)

## 2019-04-12 LAB — CBC WITH DIFFERENTIAL/PLATELET
Abs Immature Granulocytes: 0.05 10*3/uL (ref 0.00–0.07)
Basophils Absolute: 0.1 10*3/uL (ref 0.0–0.1)
Basophils Relative: 1 %
Eosinophils Absolute: 0 10*3/uL (ref 0.0–0.5)
Eosinophils Relative: 0 %
HCT: 34.5 % — ABNORMAL LOW (ref 36.0–46.0)
Hemoglobin: 11.4 g/dL — ABNORMAL LOW (ref 12.0–15.0)
Immature Granulocytes: 1 %
Lymphocytes Relative: 6 %
Lymphs Abs: 0.5 10*3/uL — ABNORMAL LOW (ref 0.7–4.0)
MCH: 31.6 pg (ref 26.0–34.0)
MCHC: 33 g/dL (ref 30.0–36.0)
MCV: 95.6 fL (ref 80.0–100.0)
Monocytes Absolute: 0.6 10*3/uL (ref 0.1–1.0)
Monocytes Relative: 6 %
Neutro Abs: 7.6 10*3/uL (ref 1.7–7.7)
Neutrophils Relative %: 86 %
Platelets: 201 10*3/uL (ref 150–400)
RBC: 3.61 MIL/uL — ABNORMAL LOW (ref 3.87–5.11)
RDW: 14.2 % (ref 11.5–15.5)
WBC: 8.8 10*3/uL (ref 4.0–10.5)
nRBC: 0 % (ref 0.0–0.2)

## 2019-04-12 LAB — COMPREHENSIVE METABOLIC PANEL
ALT: 14 U/L (ref 0–44)
AST: 26 U/L (ref 15–41)
Albumin: 3.6 g/dL (ref 3.5–5.0)
Alkaline Phosphatase: 62 U/L (ref 38–126)
Anion gap: 11 (ref 5–15)
BUN: 22 mg/dL (ref 8–23)
CO2: 22 mmol/L (ref 22–32)
Calcium: 8.8 mg/dL — ABNORMAL LOW (ref 8.9–10.3)
Chloride: 106 mmol/L (ref 98–111)
Creatinine, Ser: 1.2 mg/dL — ABNORMAL HIGH (ref 0.44–1.00)
GFR calc Af Amer: 50 mL/min — ABNORMAL LOW (ref >60)
GFR calc non Af Amer: 43 mL/min — ABNORMAL LOW (ref >60)
Glucose, Bld: 141 mg/dL — ABNORMAL HIGH (ref 70–99)
Potassium: 3.2 mmol/L — ABNORMAL LOW (ref 3.5–5.1)
Sodium: 139 mmol/L (ref 135–145)
Total Bilirubin: 1.6 mg/dL — ABNORMAL HIGH (ref 0.3–1.2)
Total Protein: 7.3 g/dL (ref 6.5–8.1)

## 2019-04-12 LAB — RESPIRATORY PANEL BY RT PCR (FLU A&B, COVID)
Influenza A by PCR: NEGATIVE
Influenza B by PCR: NEGATIVE
SARS Coronavirus 2 by RT PCR: NEGATIVE

## 2019-04-12 LAB — PROCALCITONIN: Procalcitonin: <0.1 ng/mL

## 2019-04-12 LAB — URINALYSIS, COMPLETE (UACMP) WITH MICROSCOPIC
Bilirubin Urine: NEGATIVE
Glucose, UA: NEGATIVE mg/dL
Ketones, ur: NEGATIVE mg/dL
Leukocytes,Ua: NEGATIVE
Nitrite: NEGATIVE
Protein, ur: 100 mg/dL — AB
RBC / HPF: >50 RBC/hpf — ABNORMAL HIGH (ref 0–5)
Specific Gravity, Urine: 1.008 (ref 1.005–1.030)
Squamous Epithelial / HPF: NONE SEEN (ref 0–5)
pH: 6 (ref 5.0–8.0)

## 2019-04-12 LAB — C-REACTIVE PROTEIN: CRP: 2.2 mg/dL — ABNORMAL HIGH (ref <1.0)

## 2019-04-12 LAB — BRAIN NATRIURETIC PEPTIDE: B Natriuretic Peptide: 877 pg/mL — ABNORMAL HIGH (ref 0.0–100.0)

## 2019-04-12 LAB — PROTIME-INR
INR: 1.2 (ref 0.8–1.2)
Prothrombin Time: 15.3 seconds — ABNORMAL HIGH (ref 11.4–15.2)

## 2019-04-12 LAB — FIBRINOGEN: Fibrinogen: 529 mg/dL — ABNORMAL HIGH (ref 210–475)

## 2019-04-12 LAB — FIBRIN DERIVATIVES D-DIMER (ARMC ONLY): Fibrin derivatives D-dimer (ARMC): 3977.17 ng/mL (FEU) — ABNORMAL HIGH (ref 0.00–499.00)

## 2019-04-12 LAB — LACTIC ACID, PLASMA
Lactic Acid, Venous: 1.2 mmol/L (ref 0.5–1.9)
Lactic Acid, Venous: 1.3 mmol/L (ref 0.5–1.9)

## 2019-04-12 LAB — APTT: aPTT: 33 seconds (ref 24–36)

## 2019-04-12 LAB — TRIGLYCERIDES: Triglycerides: 96 mg/dL (ref <150)

## 2019-04-12 LAB — FERRITIN: Ferritin: 157 ng/mL (ref 11–307)

## 2019-04-12 LAB — TROPONIN I (HIGH SENSITIVITY)
Troponin I (High Sensitivity): 560 ng/L (ref <18)
Troponin I (High Sensitivity): 2523 ng/L (ref <18)
Troponin I (High Sensitivity): 3585 ng/L (ref <18)

## 2019-04-12 LAB — HEPARIN LEVEL (UNFRACTIONATED): Heparin Unfractionated: >3.6 IU/mL — ABNORMAL HIGH (ref 0.30–0.70)

## 2019-04-12 LAB — POC SARS CORONAVIRUS 2 AG: SARS Coronavirus 2 Ag: NEGATIVE

## 2019-04-12 MED ORDER — HEPARIN BOLUS VIA INFUSION
1500.0000 [IU] | Freq: Once | INTRAVENOUS | Status: AC
  Administered 2019-04-12: 1500 [IU] via INTRAVENOUS

## 2019-04-12 MED ORDER — CARVEDILOL 25 MG PO TABS
25.0000 mg | ORAL_TABLET | Freq: Two times a day (BID) | ORAL | Status: DC
  Administered 2019-04-13 – 2019-04-17 (×7): 25 mg via ORAL
  Filled 2019-04-12: qty 1
  Filled 2019-04-12: qty 2
  Filled 2019-04-12 (×3): qty 1
  Filled 2019-04-12 (×2): qty 2
  Filled 2019-04-12: qty 1

## 2019-04-12 MED ORDER — CLOPIDOGREL BISULFATE 75 MG PO TABS: 75.0000 mg | ORAL_TABLET | Freq: Every day | ORAL | Status: DC

## 2019-04-12 MED ORDER — SODIUM CHLORIDE 0.9% FLUSH
3.0000 mL | Freq: Two times a day (BID) | INTRAVENOUS | Status: DC
  Administered 2019-04-12 – 2019-04-16 (×5): 3 mL via INTRAVENOUS

## 2019-04-12 MED ORDER — FUROSEMIDE 10 MG/ML IJ SOLN
80.0000 mg | Freq: Once | INTRAMUSCULAR | Status: AC
  Administered 2019-04-12: 80 mg via INTRAVENOUS
  Filled 2019-04-12: qty 8

## 2019-04-12 MED ORDER — CLONIDINE HCL 0.2 MG/24HR TD PTWK: 0.2000 mg | MEDICATED_PATCH | TRANSDERMAL | Status: DC

## 2019-04-12 MED ORDER — APIXABAN 2.5 MG PO TABS: 2.5000 mg | ORAL_TABLET | Freq: Two times a day (BID) | ORAL | Status: DC

## 2019-04-12 MED ORDER — HEPARIN (PORCINE) 25000 UT/250ML-% IV SOLN
650.0000 [IU]/h | INTRAVENOUS | Status: DC
  Administered 2019-04-14: 650 [IU]/h via INTRAVENOUS
  Filled 2019-04-12: qty 250

## 2019-04-12 MED ORDER — COENZYME Q10 100 MG PO CAPS: 1.0000 | ORAL_CAPSULE | Freq: Every day | ORAL | Status: DC

## 2019-04-12 MED ORDER — POTASSIUM CHLORIDE 20 MEQ PO PACK
40.0000 meq | PACK | Freq: Once | ORAL | Status: AC
  Administered 2019-04-12: 40 meq via ORAL
  Filled 2019-04-12: qty 2

## 2019-04-12 MED ORDER — NITROGLYCERIN 0.4 MG/SPRAY TL SOLN: 1.0000 | Status: DC | PRN

## 2019-04-12 MED ORDER — ATORVASTATIN CALCIUM 20 MG PO TABS
40.0000 mg | ORAL_TABLET | Freq: Every day | ORAL | Status: DC
  Administered 2019-04-12 – 2019-04-17 (×5): 40 mg via ORAL
  Filled 2019-04-12 (×5): qty 2

## 2019-04-12 MED ORDER — ALPRAZOLAM 0.25 MG PO TABS
0.2500 mg | ORAL_TABLET | Freq: Two times a day (BID) | ORAL | Status: DC | PRN
  Administered 2019-04-14: 0.25 mg via ORAL
  Filled 2019-04-12: qty 1

## 2019-04-12 MED ORDER — NITROGLYCERIN 0.4 MG SL SUBL: 0.4000 mg | SUBLINGUAL_TABLET | SUBLINGUAL | Status: DC | PRN

## 2019-04-12 MED ORDER — ONDANSETRON HCL 4 MG/2ML IJ SOLN: 4.0000 mg | Freq: Four times a day (QID) | INTRAMUSCULAR | Status: DC | PRN

## 2019-04-12 MED ORDER — MIRTAZAPINE 15 MG PO TABS
15.0000 mg | ORAL_TABLET | Freq: Every day | ORAL | Status: DC
  Administered 2019-04-12 – 2019-04-16 (×5): 15 mg via ORAL
  Filled 2019-04-12 (×6): qty 1

## 2019-04-12 MED ORDER — SODIUM CHLORIDE 0.9% FLUSH
3.0000 mL | INTRAVENOUS | Status: DC | PRN
  Administered 2019-04-17: 3 mL via INTRAVENOUS

## 2019-04-12 MED ORDER — SENNOSIDES-DOCUSATE SODIUM 8.6-50 MG PO TABS: 1.0000 | ORAL_TABLET | Freq: Every evening | ORAL | Status: DC | PRN

## 2019-04-12 MED ORDER — FUROSEMIDE 10 MG/ML IJ SOLN: 40.0000 mg | Freq: Two times a day (BID) | INTRAMUSCULAR | Status: DC

## 2019-04-12 MED ORDER — ZOLPIDEM TARTRATE 5 MG PO TABS: 5.0000 mg | ORAL_TABLET | Freq: Every evening | ORAL | Status: DC | PRN

## 2019-04-12 MED ORDER — LISINOPRIL 10 MG PO TABS
40.0000 mg | ORAL_TABLET | Freq: Every day | ORAL | Status: DC
  Filled 2019-04-12: qty 4

## 2019-04-12 MED ORDER — SODIUM CHLORIDE 0.9 % IV SOLN: 250.0000 mL | INTRAVENOUS | Status: DC | PRN

## 2019-04-12 MED ORDER — ACETAMINOPHEN 325 MG PO TABS
650.0000 mg | ORAL_TABLET | ORAL | Status: DC | PRN
  Administered 2019-04-15: 650 mg via ORAL
  Filled 2019-04-12: qty 2

## 2019-04-12 MED ORDER — HEPARIN (PORCINE) 25000 UT/250ML-% IV SOLN
INTRAVENOUS | Status: AC
  Administered 2019-04-12: 750 [IU]/h via INTRAVENOUS
  Filled 2019-04-12: qty 250

## 2019-04-12 NOTE — ED Notes (Signed)
Admitting MD at bedside.

## 2019-04-12 NOTE — ED Notes (Signed)
Called lab to request phlebotomist  

## 2019-04-12 NOTE — ED Triage Notes (Signed)
Pt to ER with c/o increasing SHOB over last several days.  PT reports nonproductive cough.  Pt noted to have 80% RA sats.  Placed on 2L [place] at this time.  PT does not wear O2 normally.

## 2019-04-12 NOTE — Consult Note (Signed)
ANTICOAGULATION CONSULT NOTE - Initial Consult  Pharmacy Consult for Heparin Infusion Indication: chest pain/ACS  Allergies  Allergen Reactions  . Aspirin Palpitations    Per Pt: Makes heart flutter.    Patient Measurements: Height: 5' (152.4 cm) Weight: 176 lb 5.9 oz (80 kg) IBW/kg (Calculated) : 45.5 Heparin Dosing Weight: 63.8 kg  Vital Signs: Temp: 98.2 F (36.8 C) (03/12 1603) BP: 157/103 (03/12 1930) Pulse Rate: 96 (03/12 2000)  Labs: Recent Labs    04/12/19 1727  HGB 11.4*  HCT 34.5*  PLT 201  CREATININE 1.20*  TROPONINIHS 560*    Estimated Creatinine Clearance: 35.6 mL/min (A) (by C-G formula based on SCr of 1.2 mg/dL (H)).   Medical History: Past Medical History:  Diagnosis Date  . A-fib (HCC)   . Stroke [facility])     Medications:  Apixaban 2.5 mg BID prior to admission (last dose 3/12 @ 1200)  Assessment: Patient is a 79 y/o F with a past medical history including Afib and stroke who presented with shortness of breath. Troponins elevated to 560. Pharmacy has been consulted to initiate heparin for ACS.   Baseline CBC significant for mild anemia (Hgb 11.4), Plt WNL. Baseline coags pending.   Goal of Therapy:  aPTT 66-102 seconds Monitor platelets by anticoagulation protocol: Yes   Plan:  -Heparin 1500 unit bolus IV x 1 followed by maintenance infusion at 750 units/hr -aPTT in 8 hours. Will follow aPTTs for now in view of apixaban interference with heparin level -Daily CBC per protocol  Tressie Ellis  Pharmacy Resident 04/12/2019,8:11 PM

## 2019-04-12 NOTE — ED Provider Notes (Addendum)
[facility] Emergency Department Provider Note ____________________________________________   First MD Initiated Contact with Patient 04/12/19 1601     (approximate)  I have reviewed the triage vital signs and the nursing notes.   HISTORY  Chief Complaint Shortness of Breath  Level 5 caveat: History present illness limited due to respiratory distress  HPI Valerie Trujillo is a 79 y.o. female with PMH as noted below who presents with shortness of breath, gradual onset over the last 5 or 6 days and acutely worsened today.  The patient reports some tightness to her chest but denies other acute pain.  She has not noted any cough or fever, although she is sweating now.  She also reports swelling to both legs which is gradually been getting worse.  She has not been around anyone with COVID-19.  Per EMS, O2 saturation was around 80% on room air.  The patient is not normally on oxygen.  Past Medical History:  Diagnosis Date  . A-fib (HCC)   . Stroke [facility])     Patient Active Problem List   Diagnosis Date Noted  . Acute respiratory failure (HCC) 04/12/2019  . Hypertensive emergency 01/14/2018  . Uncontrolled hypertension 01/14/2018    Past Surgical History:  Procedure Laterality Date  . CORONARY ARTERY BYPASS GRAFT  1980    Prior to Admission medications   Medication Sig Start Date End Date Taking? Authorizing Provider  acetaminophen (TYLENOL) 325 MG tablet Take 2 tablets (650 mg total) by mouth every 6 (six) hours as needed for mild pain (or Fever >/= 101). 01/17/18   Ramonita Lab, MD  apixaban (ELIQUIS) 2.5 MG TABS tablet Take 1 tablet by mouth 2 (two) times daily.    [provider]  atorvastatin (LIPITOR) 40 MG tablet Take 1 tablet (40 mg total) by mouth daily. 01/17/18 01/17/19  Ramonita Lab, MD  carvedilol (COREG) 25 MG tablet Take 1 tablet (25 mg total) by mouth 2 (two) times daily with a meal. 01/17/18   Gouru, Aruna, MD  cloNIDine (CATAPRES -  DOSED IN MG/24 HR) 0.2 mg/24hr patch Place 1 patch (0.2 mg total) onto the skin once a week. 01/22/18   Ramonita Lab, MD  clopidogrel (PLAVIX) 75 MG tablet Take 1 tablet by mouth daily.    [provider]  Coenzyme Q10 100 MG capsule Take 1 capsule by mouth daily.    [provider]  lisinopril (PRINIVIL,ZESTRIL) 40 MG tablet Take 1 tablet (40 mg total) by mouth daily. 01/18/18   Ramonita Lab, MD  mirtazapine (REMERON) 15 MG tablet Take 1 tablet by mouth at bedtime. 01/10/18 01/10/19  [provider]  senna-docusate (SENOKOT-S) 8.6-50 MG tablet Take 1 tablet by mouth at bedtime as needed for mild constipation. 01/17/18   Ramonita Lab, MD    Allergies Aspirin  Family History  Problem Relation Age of Onset  . Heart disease Mother   . Diabetes Mellitus II Father   . Heart disease Father     Social History Social History   Tobacco Use  . Smoking status: Former Games developer  . Smokeless tobacco: Never Used  Substance Use Topics  . Alcohol use: Never  . Drug use: Never    Review of Systems Level 5 caveat: Unable to 10 review of systems due to respiratory distress   ____________________________________________   PHYSICAL EXAM:  VITAL SIGNS: ED Triage Vitals  Enc Vitals Group     BP 04/12/19 1617 (!) 133/91     Pulse Rate 04/12/19 1603 (!)  111     Resp 04/12/19 1617 (!) 29     Temp 04/12/19 1603 98.2 F (36.8 C)     Temp src --      SpO2 04/12/19 1603 (!) 79 %     Weight 04/12/19 1613 176 lb 5.9 oz (80 kg)     Height 04/12/19 1613 5' (1.524 m)     Head Circumference --      Peak Flow --      Pain Score --      Pain Loc --      Pain Edu? --      Excl. in [place]? --     Constitutional: Alert, uncomfortable appearing, diaphoretic.  Speaking in short sentences. Eyes: Conjunctivae are normal.  Head: Atraumatic. Nose: No congestion/rhinnorhea. Mouth/Throat: Mucous membranes are moist.   Neck: Normal range of motion.  Cardiovascular: Borderline  tachycardic, regular rhythm. Grossly normal heart sounds.  Good peripheral circulation. Respiratory: Increased respiratory effort with accessory muscle use.  Decreased breath sounds bilaterally. Gastrointestinal: Soft and nontender. No distention.  Genitourinary: No flank tenderness. Musculoskeletal: 2+ bilateral lower extremity edema.  Extremities warm and well perfused.  Neurologic: Motor intact in all extremities.  Skin:  Skin is warm and dry. No rash noted. Psychiatric: Calm and cooperative.  ____________________________________________   LABS (all labs ordered are listed, but only abnormal results are displayed)  Labs Reviewed  COMPREHENSIVE METABOLIC PANEL - Abnormal; Notable for the following components:      Result Value   Potassium 3.2 (*)    Glucose, Bld 141 (*)    Creatinine, Ser 1.20 (*)    Calcium 8.8 (*)    Total Bilirubin 1.6 (*)    GFR calc non Af Amer 43 (*)    GFR calc Af Amer 50 (*)    All other components within normal limits  CBC WITH DIFFERENTIAL/PLATELET - Abnormal; Notable for the following components:   RBC 3.61 (*)    Hemoglobin 11.4 (*)    HCT 34.5 (*)    Lymphs Abs 0.5 (*)    All other components within normal limits  FIBRIN DERIVATIVES D-DIMER ([facility] ONLY) - Abnormal; Notable for the following components:   Fibrin derivatives D-dimer [facility]) 0,865.78 (*)    All other components within normal limits  FIBRINOGEN - Abnormal; Notable for the following components:   Fibrinogen 529 (*)    All other components within normal limits  TROPONIN I (HIGH SENSITIVITY) - Abnormal; Notable for the following components:   Troponin I (High Sensitivity) 560 (*)    All other components within normal limits  CULTURE, BLOOD (ROUTINE X 2)  CULTURE, BLOOD (ROUTINE X 2)  RESPIRATORY PANEL BY RT PCR (FLU A&B, COVID)  PROCALCITONIN  FERRITIN  LACTIC ACID, PLASMA  LACTIC ACID, PLASMA  URINALYSIS, COMPLETE (UACMP) WITH MICROSCOPIC  TRIGLYCERIDES  C-REACTIVE PROTEIN    BASIC METABOLIC PANEL  CBC WITH DIFFERENTIAL/PLATELET  MAGNESIUM  BRAIN NATRIURETIC PEPTIDE  POC SARS CORONAVIRUS 2 AG -  ED  POC SARS CORONAVIRUS 2 AG  TROPONIN I (HIGH SENSITIVITY)  TROPONIN I (HIGH SENSITIVITY)   ____________________________________________  EKG  ED ECG REPORT I, Arta Silence, the attending physician, personally viewed and interpreted this ECG.  Date: 04/12/2019 EKG Time: 1612 Rate: 97 Rhythm: Atrial fibrillation QRS Axis: normal Intervals: LBBB ST/T Wave abnormalities: normal Narrative Interpretation: no evidence of acute ischemia  ____________________________________________  RADIOLOGY  CXR: Bilateral pleural effusions and possible edema  ____________________________________________   PROCEDURES  Procedure(s) performed: No  Procedures  Critical  Care performed: Yes  CRITICAL CARE Performed by: Dionne Bucy   Total critical care time: 45 minutes  Critical care time was exclusive of separately billable procedures and treating other patients.  Critical care was necessary to treat or prevent imminent or life-threatening deterioration.  Critical care was time spent personally by me on the following activities: development of treatment plan with patient and/or surrogate as well as nursing, discussions with consultants, evaluation of patient's response to treatment, examination of patient, obtaining history from patient or surrogate, ordering and performing treatments and interventions, ordering and review of laboratory studies, ordering and review of radiographic studies, pulse oximetry and re-evaluation of patient's condition. ____________________________________________   INITIAL IMPRESSION / ASSESSMENT AND PLAN / ED COURSE  Pertinent labs & imaging results that were available during my care of the patient were reviewed by me and considered in my medical decision making (see chart for details).  79 year old female with PMH as  noted above including atrial fibrillation, hypertension, stroke, and CAD presents with shortness of breath over the last 5 days, acutely worsened today.  She also reports bilateral lower extremity edema.  She denies any cough or fever and has not been around anyone with Covid.  I reviewed the past medical records in Epic.  The patient was most recently admitted in 2019 with a hypertensive emergency.  She had an echocardiogram showing a normal EF.  On exam today, the patient is in moderate respiratory distress, alert and able to speak in short sentences.  O2 saturation was in the high 70s to low 80s on room air.  It came up to the low 90s on 4 L by nasal cannula, but the patient continued to have significant increased work of breathing.  She has decreased breath sounds bilaterally and bilateral peripheral edema.  Overall presentation is most consistent with acute new onset CHF.  Differential includes bacterial pneumonia, COVID-19, acute bronchitis, or possible ACS.  I have a low suspicion for pulmonary embolism given the lack of chest pain and the gradual worsening of the symptoms.  We will obtain a chest x-ray, COVID-19 swab, lab work-up, and reassess.  Due to the work of breathing and persistent hypoxia on nasal cannula I have called for BiPAP.  ----------------------------------------- 7:21 PM on 04/12/2019 -----------------------------------------  The patient was doing very well on BiPAP.  We did a trial off of BiPAP on high flow nasal cannula, but the patient had increased work of breathing and required 10 L.  We will put her back on.  Work-up so far is most consistent with CHF, with elevated BNP and findings of effusions and edema on the chest x-ray.  I have ordered IV Lasix.  I discussed the case with the hospitalist Dr. Arville Care for admission.  ----------------------------------------- 8:09 PM on 04/12/2019 -----------------------------------------  Because the patient's troponin is slightly  more elevated than I might expect from just demand ischemia, and her EKG shows an LBBB which was not seen previously, there is some concern that the patient could have had an acute MI recently that precipitated CHF.  The patient has no active chest pain at this time.  I consulted Dr. Gwen Pounds from cardiology who agreed with putting the patient on a heparin drip for now, and holding her antiplatelet and Eliquis.  I informed the hospitalist of these recommendations.  ____________________________  Valerie Trujillo was evaluated in Emergency Department on 04/12/2019 for the symptoms described in the history of present illness. She was evaluated in the context of the global COVID-19 pandemic,  which necessitated consideration that the patient might be at risk for infection with the SARS-CoV-2 virus that causes COVID-19. Institutional protocols and algorithms that pertain to the evaluation of patients at risk for COVID-19 are in a state of rapid change based on information released by regulatory bodies including the CDC and federal and state organizations. These policies and algorithms were followed during the patient's care in the ED.  ____________________________________________   FINAL CLINICAL IMPRESSION(S) / ED DIAGNOSES  Final diagnoses:  Acute on chronic respiratory failure with hypoxia (HCC)      NEW MEDICATIONS STARTED DURING THIS VISIT:  New Prescriptions   No medications on file     Note:  This document was prepared using Dragon voice recognition software and may include unintentional dictation errors.    Dionne Bucy, MD 04/12/19 Wyline Copas, MD 04/12/19 2010

## 2019-04-12 NOTE — ED Notes (Signed)
Pt is difficult stick, unable to obtain blood work at this time, IV team consulted.

## 2019-04-12 NOTE — H&P (Signed)
[facility]   PATIENT NAME: Valerie Trujillo    MR#:  [id number]  DATE OF BIRTH:  26-Apr-1940  DATE OF ADMISSION:  04/12/2019  PRIMARY CARE PHYSICIAN: Woodroe Chen, MD   REQUESTING/REFERRING PHYSICIAN: Dionne Bucy, MD CHIEF COMPLAINT:   Chief Complaint  Patient presents with  . Shortness of Breath    HISTORY OF PRESENT ILLNESS:  Valerie Trujillo  is a 79 y.o. 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  She admits to nausea without vomiting this morning.  No fever or chills.  She denies any cough or wheezing.  No chest pain or palpitations.  When she came to the ER, she was afebrile and heart rate was 111 with a blood pressure 133/91 pulse 70 was 6 6 to 79% on room air and it came up to 98-high percent on BiPAP.  She was tachypneic to 24-29 and blood pressure later was 157/103.  Labs revealed mild hypokalemia of 3.2 BUN and creatinine of 22/1.2.  BNP was 877 and high-sensitivity troponin I was 460 and later 2523.  Lactic acid was 1.2.  Ferritin was 157 and CRP 2.2.  Procalcitonin was less than 0.1.  CBC showed mild anemia.  Influenza antigens and COVID-19 PCR came back negative.  Urinalysis showed more than 50 RBCs and rare bacteria with 0-5 WBCs.  Portable chest ray showed moderate bilateral pleural effusions with adjacent atelectasis and probably mild pulmonary edema and mild cardiomegaly.  EKG showed normal sinus rhythm with a rate of 97 with left bundle branch block.  The patient had an EKG at his cardiologist office on 12/13/2018 showed normal sinus rhythm then without documented left bundle branch block.  The patient was given 80 mg of IV Lasix.  The patient was initially placed  on BiPAP and that was tapered down to nasal cannula and she did not tolerated she was placed on high flow nasal cannula at 10 L/min with adequate pulse oximetry.  Consult was made to Dr. Gwen Pounds who is aware about the patient.  He recommended IV heparin while holding off Plavix and Eliquis.  The patient will be admitted to stepdown unit for further evaluation and management. PAST MEDICAL HISTORY:   Past Medical History:  Diagnosis Date  . A-fib (HCC)   . Stroke [facility])   Coronary artery disease, status post three-vessel CABG  PAST SURGICAL HISTORY:   Past Surgical History:  Procedure Laterality Date  . CORONARY ARTERY BYPASS GRAFT  1980    SOCIAL HISTORY:   Social History   Tobacco Use  . Smoking status: Former Games developer  . Smokeless tobacco: Never Used  Substance Use Topics  . Alcohol use: Never    FAMILY HISTORY:   Family History  Problem Relation Age of Onset  . Heart disease Mother   . Diabetes Mellitus II Father   . Heart disease Father     DRUG ALLERGIES:   Allergies  Allergen Reactions  . Aspirin Palpitations    Per Pt: Makes heart flutter.    REVIEW OF SYSTEMS:   ROS As per history of present illness. All pertinent systems were reviewed above. Constitutional,  HEENT, cardiovascular, respiratory, GI, GU, musculoskeletal, neuro, psychiatric, endocrine,  integumentary and hematologic systems were reviewed and  are otherwise  negative/unremarkable except for positive findings mentioned above in the HPI.   MEDICATIONS AT HOME:   Prior to Admission medications   Medication Sig Start Date End Date Taking? Authorizing Provider  acetaminophen (TYLENOL) 325 MG tablet Take 2 tablets (650 mg total) by mouth every 6 (six) hours as needed for mild pain (or Fever >/= 101). 01/17/18   Ramonita Lab, MD  apixaban (ELIQUIS) 2.5 MG TABS tablet Take 1 tablet by mouth 2 (two) times daily.    [provider]  atorvastatin (LIPITOR) 40 MG tablet Take 1 tablet (40 mg  total) by mouth daily. 01/17/18 01/17/19  Ramonita Lab, MD  carvedilol (COREG) 25 MG tablet Take 1 tablet (25 mg total) by mouth 2 (two) times daily with a meal. 01/17/18   Gouru, Aruna, MD  cloNIDine (CATAPRES - DOSED IN MG/24 HR) 0.2 mg/24hr patch Place 1 patch (0.2 mg total) onto the skin once a week. 01/22/18   Ramonita Lab, MD  clopidogrel (PLAVIX) 75 MG tablet Take 1 tablet by mouth daily.    [provider]  Coenzyme Q10 100 MG capsule Take 1 capsule by mouth daily.    [provider]  lisinopril (PRINIVIL,ZESTRIL) 40 MG tablet Take 1 tablet (40 mg total) by mouth daily. 01/18/18   Ramonita Lab, MD  mirtazapine (REMERON) 15 MG tablet Take 1 tablet by mouth at bedtime. 01/10/18 01/10/19  [provider]  senna-docusate (SENOKOT-S) 8.6-50 MG tablet Take 1 tablet by mouth at bedtime as needed for mild constipation. 01/17/18   Gouru, Deanna Artis, MD      VITAL SIGNS:  Blood pressure (!) 157/103, pulse 96, temperature 98.2 F (36.8 C), resp. rate (!) 24, height 5' (1.524 m), weight 80 kg, SpO2 93 %.  PHYSICAL EXAMINATION:  Physical Exam  GENERAL:  79 y.o.-year-old African-American female patient lying in the bed with mild respiratory distress with conversational dyspnea on high flow nasal cannula. EYES: Pupils equal, round, reactive to light and accommodation. No scleral icterus. Extraocular muscles intact.  HEENT: Head atraumatic, normocephalic. Oropharynx and nasopharynx clear.  NECK:  Supple, no jugular venous distention. No thyroid enlargement, no tenderness.  LUNGS: Diminished bibasal breath sounds with minimal rales. CARDIOVASCULAR: Regular rate and rhythm, S1, S2 normal. No murmurs, rubs, or gallops.  ABDOMEN: Soft, nondistended, nontender. Bowel sounds present. No organomegaly or mass.  EXTREMITIES: 2+ bilateral lower extremity pitting edema with no clubbing or cyanosis.  NEUROLOGIC: Cranial nerves II through XII are intact. Muscle strength 5/5 in all  extremities. Sensation intact. Gait not checked.  PSYCHIATRIC: The patient is alert and oriented x 3.  Normal affect and good eye contact. SKIN: No obvious rash, lesion, or ulcer.   LABORATORY PANEL:   CBC Recent Labs  Lab 04/12/19 1727  WBC 8.8  HGB 11.4*  HCT 34.5*  PLT 201   ------------------------------------------------------------------------------------------------------------------  Chemistries  Recent Labs  Lab 04/12/19 1727  NA 139  K 3.2*  CL 106  CO2 22  GLUCOSE 141*  BUN 22  CREATININE 1.20*  CALCIUM 8.8*  AST 26  ALT 14  ALKPHOS 62  BILITOT 1.6*   ------------------------------------------------------------------------------------------------------------------  Cardiac Enzymes No results for input(s): TROPONINI in the last 168 hours. ------------------------------------------------------------------------------------------------------------------  RADIOLOGY:  DG Chest Portable 1 View  Result Date: 04/12/2019 CLINICAL DATA:  Shortness of breath EXAM: PORTABLE CHEST 1 VIEW COMPARISON:  None. FINDINGS: Moderate bilateral pleural effusions with adjacent atelectasis. Probable mild pulmonary edema. No pneumothorax. Mild cardiomegaly. IMPRESSION: Moderate bilateral pleural effusions with adjacent atelectasis.  Probable mild pulmonary edema. Mild cardiomegaly. Electronically Signed   By: Macy Mis M.D.   On: 04/12/2019 16:52      IMPRESSION AND PLAN:  1.  Non-ST elevation MI likely with subsequent acute heart failure possibly diastolic though systolic CHF would be in differential diagnosis, with subsequent acute respiratory failure with hypoxia.  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  Most recent EF was 55 to 65% in December 2019 with mild mitral regurgitation moderate left atrial and right atrial enlargement and moderate to severe aortic stenosis. -The patient will be admitted to a stepdown unit. -She will be  diuresed with IV Lasix. -Unfortunately due to her gross hematuria, IV heparin was held off.  Will started with improvement of her hematuria.  We will follow her H&H. -We will continue aspirin for now though. -The patient will be placed on statin therapy and will continue her beta-blocker therapy as well as ACE inhibitor. -She will be placed on as needed IV morphine sulfate and sublingual nitroglycerin should she have any chest pain. -Cardiology consultation was obtained by Dr. Nehemiah Massed who is aware about the patient.  2.  Dyslipidemia. -Statin therapy will be resumed.  3.  Hypertension. -Catapres patch and Zestril will be resumed as well as Coreg.  4.  Anxiety. -As needed Xanax will be continued.  5.  Dementia. -She has no current behavioral changes.  6.  DVT prophylaxis. -SCDs for now.  With improvement of her hematuria IV heparin will be placed.    All the records are reviewed and case discussed with ED provider. The plan of care was discussed in details with the patient (and family). I answered all questions. The patient agreed to proceed with the above mentioned plan. Further management will depend upon hospital course.   CODE STATUS: Full code  TOTAL TIME TAKING CARE OF THIS PATIENT: 60 minutes.    Christel Mormon M.D on 04/12/2019 at 8:26 PM  [facility]   From 7 PM-7 AM, contact night-coverage [email or web address]  CC: Primary care physician; Joseph Berkshire, MD   Note: This dictation was prepared with Dragon dictation along with smaller phrase technology. Any transcriptional errors that result from this process are unintentional.

## 2019-04-12 NOTE — ED Notes (Signed)
Phlebotomist at bedside.

## 2019-04-13 ENCOUNTER — Inpatient Hospital Stay: Payer: Medicare Other

## 2019-04-13 ENCOUNTER — Inpatient Hospital Stay
Admit: 2019-04-13 | Discharge: 2019-04-13 | Disposition: A | Discharge status: Home / Self Care | Payer: Medicare Other | Attending: Family Medicine | Admitting: Family Medicine

## 2019-04-13 LAB — CBC WITH DIFFERENTIAL/PLATELET
Abs Immature Granulocytes: 0.04 10*3/uL (ref 0.00–0.07)
Basophils Absolute: 0.1 10*3/uL (ref 0.0–0.1)
Basophils Relative: 1 %
Eosinophils Absolute: 0.1 10*3/uL (ref 0.0–0.5)
Eosinophils Relative: 1 %
HCT: 37.3 % (ref 36.0–46.0)
Hemoglobin: 12.3 g/dL (ref 12.0–15.0)
Immature Granulocytes: 0 %
Lymphocytes Relative: 12 %
Lymphs Abs: 1.1 10*3/uL (ref 0.7–4.0)
MCH: 31.5 pg (ref 26.0–34.0)
MCHC: 33 g/dL (ref 30.0–36.0)
MCV: 95.6 fL (ref 80.0–100.0)
Monocytes Absolute: 1 10*3/uL (ref 0.1–1.0)
Monocytes Relative: 10 %
Neutro Abs: 7.4 10*3/uL (ref 1.7–7.7)
Neutrophils Relative %: 76 %
Platelets: 188 10*3/uL (ref 150–400)
RBC: 3.9 MIL/uL (ref 3.87–5.11)
RDW: 14.4 % (ref 11.5–15.5)
WBC: 9.7 10*3/uL (ref 4.0–10.5)
nRBC: 0 % (ref 0.0–0.2)

## 2019-04-13 LAB — PROCALCITONIN: Procalcitonin: 0.14 ng/mL

## 2019-04-13 LAB — BASIC METABOLIC PANEL
Anion gap: 10 (ref 5–15)
BUN: 22 mg/dL (ref 8–23)
CO2: 26 mmol/L (ref 22–32)
Calcium: 8.8 mg/dL — ABNORMAL LOW (ref 8.9–10.3)
Chloride: 106 mmol/L (ref 98–111)
Creatinine, Ser: 0.95 mg/dL (ref 0.44–1.00)
GFR calc Af Amer: >60 mL/min (ref >60)
GFR calc non Af Amer: 57 mL/min — ABNORMAL LOW (ref >60)
Glucose, Bld: 98 mg/dL (ref 70–99)
Potassium: 3.6 mmol/L (ref 3.5–5.1)
Sodium: 142 mmol/L (ref 135–145)

## 2019-04-13 LAB — TROPONIN I (HIGH SENSITIVITY)
Troponin I (High Sensitivity): 3641 ng/L (ref <18)
Troponin I (High Sensitivity): 4356 ng/L (ref <18)
Troponin I (High Sensitivity): 5085 ng/L (ref <18)

## 2019-04-13 LAB — APTT
aPTT: 132 seconds — ABNORMAL HIGH (ref 24–36)
aPTT: 89 seconds — ABNORMAL HIGH (ref 24–36)

## 2019-04-13 LAB — MRSA PCR SCREENING: MRSA by PCR: NEGATIVE

## 2019-04-13 LAB — BRAIN NATRIURETIC PEPTIDE: B Natriuretic Peptide: 573 pg/mL — ABNORMAL HIGH (ref 0.0–100.0)

## 2019-04-13 LAB — HEPARIN LEVEL (UNFRACTIONATED): Heparin Unfractionated: >3.6 IU/mL — ABNORMAL HIGH (ref 0.30–0.70)

## 2019-04-13 MED ORDER — CHLORHEXIDINE GLUCONATE CLOTH 2 % EX PADS
6.0000 | MEDICATED_PAD | Freq: Every day | CUTANEOUS | Status: DC
  Administered 2019-04-14: 6 via TOPICAL

## 2019-04-13 MED ORDER — SODIUM CHLORIDE 0.9 % IR SOLN: 3000.0000 mL | Status: DC

## 2019-04-13 MED ORDER — CLONIDINE HCL 0.1 MG/24HR TD PTWK
0.1000 mg | MEDICATED_PATCH | TRANSDERMAL | Status: DC
  Administered 2019-04-13: 0.1 mg via TRANSDERMAL
  Filled 2019-04-13: qty 1

## 2019-04-13 MED ORDER — FUROSEMIDE 10 MG/ML IJ SOLN
60.0000 mg | Freq: Two times a day (BID) | INTRAMUSCULAR | Status: DC
  Administered 2019-04-13: 60 mg via INTRAVENOUS
  Filled 2019-04-13: qty 6

## 2019-04-13 MED ORDER — MAGNESIUM SULFATE 50 % IJ SOLN: 1.0000 g | Freq: Once | INTRAVENOUS | Status: DC

## 2019-04-13 MED ORDER — FUROSEMIDE 10 MG/ML IJ SOLN
60.0000 mg | Freq: Three times a day (TID) | INTRAMUSCULAR | Status: DC
  Administered 2019-04-13 – 2019-04-15 (×6): 60 mg via INTRAVENOUS
  Filled 2019-04-13 (×6): qty 6

## 2019-04-13 MED ORDER — MAGNESIUM SULFATE IN D5W 1-5 GM/100ML-% IV SOLN
1.0000 g | Freq: Once | INTRAVENOUS | Status: AC
  Administered 2019-04-13: 1 g via INTRAVENOUS
  Filled 2019-04-13: qty 100

## 2019-04-13 NOTE — Progress Notes (Signed)
Order placed for Continuous Bladder Irrigation d/t the patient experiencing Hematuria in the ED. The patient urine is currently yellow/amber & clear. Dr. Arville Care notified. Verbal order giving to monitor patient for bleeding, discontinue the Bladder Irrigation order and obtain H&H Q6H.

## 2019-04-13 NOTE — Progress Notes (Signed)
[facility] Progress Note  Patient: Valerie Trujillo    HAL:[id number]  DOA: 04/12/2019     Date of Service: the patient was seen and examined on 04/13/2019  Chief Complaint  Patient presents with  . Shortness of Breath   Brief hospital course: CAD SP CABG, HTN, HLD, PVD, CVA, chronic A. fib, severe aortic stenosis.  Patient presented with complaints of cough and shortness of breath.  Found to have non-STEMI and acute hypoxic respiratory failure. Currently further plan is continue aggressive diuresis.  Assessment and Plan: 1.  Acute non-STEMI. 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 Acute hypoxic respiratory failure. Aortic stenosis/severe. History of CAD SP CABG protection essential hypertension. Dyslipidemia. PVD Cardiology consulted. EKG shows evidence of LBBB. Cardiology currently not planning any intervention today. Currently on IV heparin. Reducing clonidine dose to allow for aggressive diuresis. IV Lasix dose increased from 40 mg twice daily to 60 mg twice daily. Check x-ray and EKG.  Check repeat troponin. D-dimer elevated currently on therapeutic anticoagulation.  Check DVT Doppler. High risk for significant deterioration and intubation. Continue in the stepdown unit. Continue Lipitor. Continue Coreg. Echocardiogram currently pending. To allow for aggressive diuresis blood pressure medication adjusted. Currently Norvasc is on hold, clonidine reduced from 0.2 mg twice daily 2.1 mg twice daily. Currently on lisinopril 40 mg daily. Metoprolol 100 mg daily transition to Coreg 25 mg twice daily.  2.  Anxiety As needed Xanax.  3.  Concern for hematuria Overactive bladder Still has some mild pink-tinged urine. Holding Myrbetriq for now. H&H stable. Monitor.  4.  Hypokalemia Corrected  5.  Acute kidney injury Likely cardiorenal hemodynamics. Improving with diuretics. Monitor closely.  6.  Chronic anticoagulation On Eliquis. Currently on IV heparin.   Monitor.  Body mass index is 34.88 kg/m.    Interventions:        Diet: Cardiac diet DVT Prophylaxis: Therapeutic Anticoagulation with Heparin, Eliquis on hold   Advance goals of care discussion: Full code  Family Communication: no family was present at bedside, at the time of interview.   Disposition:  Pt is from home, admitted with non-STEMI, still has severe hypoxia on 10 LPM, which precludes a safe discharge. Discharge to home, when medically stable.  Subjective: Continues to have shortness of breath but no nausea no audible talking in full sentences.  No fever or chills.  No chest pain or chest tightness.  Never had 1 prior to admission as well.  Physical Exam: General:  alert oriented to time, place, and person.  Appear in moderate distress, affect appropriate Eyes: PERRL ENT: Oral Mucosa Clear, moist  Neck: no JVD,  Cardiovascular: S1 and S2 Present, no Murmur,  Respiratory: good respiratory effort, Bilateral Air entry equal and Decreased, bilateral  Crackles, no wheezes Abdomen: Bowel Sound present, Soft and no tenderness,  Skin: no rash Extremities: bilateral  Pedal edema, no calf tenderness Neurologic: without any new focal findings Gait not checked due to patient safety concerns  Vitals:   04/13/19 1100 04/13/19 1200 04/13/19 1250 04/13/19 1300  BP: 115/63   133/75  Pulse: 74 83 100 85  Resp: (!) 21 (!) 27 (!) 32 (!) 29  Temp:  98.2 F (36.8 C)    TempSrc:  Oral    SpO2: 97% 95% (!) 84% 93%  Weight:      Height:        Intake/Output Summary (Last 24 hours) at 04/13/2019 1401 Last data filed at 04/13/2019 1300 Gross per 24 hour  Intake 218.72 ml  Output 575 ml  Net -356.28 ml   Filed Weights   04/12/19 1613 04/13/19 0500  Weight: 80 kg 81 kg    Data Reviewed: I have personally reviewed and interpreted daily labs, tele strips, imagings as discussed above. I reviewed all nursing notes, pharmacy notes, vitals, pertinent old records I have  discussed plan of care as described above with RN and patient/family.  CBC: Recent Labs  Lab 04/12/19 1727 04/13/19 0541  WBC 8.8 9.7  NEUTROABS 7.6 7.4  HGB 11.4* 12.3  HCT 34.5* 37.3  MCV 95.6 95.6  PLT 201 169   Basic Metabolic Panel: Recent Labs  Lab 04/12/19 1727 04/12/19 2142 04/13/19 0541  NA 139  --  142  K 3.2*  --  3.6  CL 106  --  106  CO2 22  --  26  GLUCOSE 141*  --  98  BUN 22  --  22  CREATININE 1.20*  --  0.95  CALCIUM 8.8*  --  8.8*  MG  --  1.8  --     Studies: DG Chest Portable 1 View  Result Date: 04/12/2019 CLINICAL DATA:  Shortness of breath EXAM: PORTABLE CHEST 1 VIEW COMPARISON:  None. FINDINGS: Moderate bilateral pleural effusions with adjacent atelectasis. Probable mild pulmonary edema. No pneumothorax. Mild cardiomegaly. IMPRESSION: Moderate bilateral pleural effusions with adjacent atelectasis. Probable mild pulmonary edema. Mild cardiomegaly. Electronically Signed   By: Macy Mis M.D.   On: 04/12/2019 16:52    Scheduled Meds: . atorvastatin  40 mg Oral Daily  . carvedilol  25 mg Oral BID WC  . [START ON 04/14/2019] Chlorhexidine Gluconate Cloth  6 each Topical Daily  . cloNIDine  0.1 mg Transdermal Weekly  . furosemide  60 mg Intravenous Q12H  . lisinopril  40 mg Oral Daily  . mirtazapine  15 mg Oral QHS  . sodium chloride flush  3 mL Intravenous Q12H   Continuous Infusions: . sodium chloride    . heparin 650 Units/hr (04/13/19 1300)   PRN Meds: sodium chloride, acetaminophen, ALPRAZolam, nitroGLYCERIN, ondansetron (ZOFRAN) IV, senna-docusate, sodium chloride flush, zolpidem  Time spent: 35 minutes  Author: Berle Mull, MD [facility] 04/13/2019 2:01 PM  To reach On-call, see care teams to locate the attending and reach out to them via [email or web address]. If 7PM-7AM, please contact night-coverage If you still have difficulty reaching the attending provider, please page the [facility] (Director on Call) for [facility] on  amion for assistance.

## 2019-04-13 NOTE — Consult Note (Signed)
[facility] Cardiology Consultation Note  Patient ID: Valerie Trujillo, MRN: [id number], DOB/AGE: [birth year] 79 y.o. Admit date: 04/12/2019   Date of Consult: 04/13/2019 Primary Physician: Joseph Berkshire, MD Primary Cardiologist: Ubaldo Glassing  Chief Complaint:  Chief Complaint  Patient presents with  . Shortness of Breath   Reason for Consult: Chest pain shortness of breath  HPI: 79 y.o. female with known coronary artery disease status of post apparent coronary bypass surgery hypertension hyperlipidemia peripheral vascular disease with claudication type symptoms previous stroke with atrial fibrillation chronic in nature having severe aortic valve stenosis and now episode of chest discomfort shortness of breath pulmonary edema consistent with acute diastolic dysfunction heart failure and non-ST elevation myocardial infarction.  The patient does have an EKG showing atrial fibrillation with controlled ventricular rate and left bundle branch block additionally the patient has a troponin of 5085 consistent with non-ST elevation myocardial infarction.  Recent echocardiogram has shown that the patient has severe aortic stenosis with peak gradient of 77 mm mean gradient of 42 mm and a velocity of 4.4 m/s.  We have discussed that further treatment is necessary.  Currently the patient is getting oxygenation by BiPAP and comfortable at this time with no chest pain and improving symptoms  Past Medical History:  Diagnosis Date  . A-fib ([place])   . Stroke [facility])       Surgical History:  Past Surgical History:  Procedure Laterality Date  . CORONARY ARTERY BYPASS GRAFT  1980     Home Meds: Prior to Admission medications   Medication Sig Start Date End Date Taking? Authorizing Provider  acetaminophen (TYLENOL) 325 MG tablet Take 2 tablets (650 mg total) by mouth every 6 (six) hours as needed for mild pain (or Fever >/= 101). 01/17/18  Yes Gouru, Illene Silver, MD  amLODipine (NORVASC) 10 MG tablet Take 10 mg by  mouth daily. 03/13/19  Yes [provider]  apixaban (ELIQUIS) 2.5 MG TABS tablet Take 2.5 mg by mouth 2 (two) times daily.    Yes [provider]  atorvastatin (LIPITOR) 20 MG tablet Take 20 mg by mouth daily.   Yes [provider]  cloNIDine (CATAPRES - DOSED IN MG/24 HR) 0.2 mg/24hr patch Place 1 patch (0.2 mg total) onto the skin once a week. Patient taking differently: Place 0.2 mg onto the skin every Wednesday.  01/22/18  Yes Gouru, Illene Silver, MD  clopidogrel (PLAVIX) 75 MG tablet Take 75 mg by mouth daily.    Yes [provider]  Coenzyme Q10 100 MG capsule Take 1 capsule by mouth daily.   Yes [provider]  lisinopril (PRINIVIL,ZESTRIL) 40 MG tablet Take 1 tablet (40 mg total) by mouth daily. 01/18/18  Yes Gouru, Illene Silver, MD  metoprolol succinate (TOPROL-XL) 100 MG 24 hr tablet Take 100 mg by mouth daily. 01/14/19  Yes [provider]  mirtazapine (REMERON) 15 MG tablet Take 15 mg by mouth at bedtime.    Yes [provider]  MYRBETRIQ 50 MG TB24 tablet Take 50 mg by mouth daily. 03/14/19  Yes [provider]  senna-docusate (SENOKOT-S) 8.6-50 MG tablet Take 1 tablet by mouth at bedtime as needed for mild constipation. 01/17/18  Yes Gouru, Illene Silver, MD  vitamin B-12 (CYANOCOBALAMIN) 1000 MCG tablet Take 1,000 mcg by mouth daily.   Yes [provider]    Inpatient Medications:  . atorvastatin  40 mg Oral Daily  . carvedilol  25 mg Oral BID WC  . [START ON 04/17/2019] cloNIDine  0.2 mg  Transdermal Weekly  . furosemide  40 mg Intravenous Q12H  . lisinopril  40 mg Oral Daily  . mirtazapine  15 mg Oral QHS  . sodium chloride flush  3 mL Intravenous Q12H   . sodium chloride    . heparin 750 Units/hr (04/13/19 0600)  . sodium chloride irrigation      Allergies:  Allergies  Allergen Reactions  . Aspirin Palpitations    Per Pt: Makes heart flutter.    Social History   Socioeconomic History  . Marital status:  Single    Spouse name: Not on file  . Number of children: Not on file  . Years of education: Not on file  . Highest education level: Not on file  Occupational History  . Occupation: retired  Tobacco Use  . Smoking status: Former Games developer  . Smokeless tobacco: Never Used  Substance and Sexual Activity  . Alcohol use: Never  . Drug use: Never  . Sexual activity: Not Currently  Other Topics Concern  . Not on file  Social History Narrative  . Not on file   Social Determinants of Health   Financial Resource Strain:   . Difficulty of Paying Living Expenses:   Food Insecurity:   . Worried About Programme researcher, broadcasting/film/video in the Last Year:   . Barista in the Last Year:   Transportation Needs:   . Freight forwarder (Medical):   Marland Kitchen Lack of Transportation (Non-Medical):   Physical Activity:   . Days of Exercise per Week:   . Minutes of Exercise per Session:   Stress:   . Feeling of Stress :   Social Connections:   . Frequency of Communication with Friends and Family:   . Frequency of Social Gatherings with Friends and Family:   . Attends Religious Services:   . Active Member of Clubs or Organizations:   . Attends Banker Meetings:   Marland Kitchen Marital Status:   Intimate Partner Violence:   . Fear of Current or Ex-Partner:   . Emotionally Abused:   Marland Kitchen Physically Abused:   . Sexually Abused:      Family History  Problem Relation Age of Onset  . Heart disease Mother   . Diabetes Mellitus II Father   . Heart disease Father      Review of Systems Positive for shortness of breath Negative for: General:  chills, fever, night sweats or weight changes.  Cardiovascular: PND orthopnea syncope dizziness  Dermatological skin lesions rashes Respiratory: Cough congestion Urologic: Frequent urination urination at night and hematuria Abdominal: negative for nausea, vomiting, diarrhea, bright red blood per rectum, melena, or hematemesis Neurologic: negative for visual  changes, and/or hearing changes  All other systems reviewed and are otherwise negative except as noted above.  Labs: No results for input(s): CKTOTAL, CKMB, TROPONINI in the last 72 hours. Lab Results  Component Value Date   WBC 9.7 04/13/2019   HGB 12.3 04/13/2019   HCT 37.3 04/13/2019   MCV 95.6 04/13/2019   PLT 188 04/13/2019    Recent Labs  Lab 04/12/19 1727 04/12/19 1727 04/13/19 0541  NA 139   < > 142  K 3.2*   < > 3.6  CL 106   < > 106  CO2 22   < > 26  BUN 22   < > 22  CREATININE 1.20*   < > 0.95  CALCIUM 8.8*   < > 8.8*  PROT 7.3  --   --   BILITOT  1.6*  --   --   ALKPHOS 62  --   --   ALT 14  --   --   AST 26  --   --   GLUCOSE 141*   < > 98   < > = values in this interval not displayed.   Lab Results  Component Value Date   CHOL 164 01/17/2018   HDL 44 01/17/2018   LDLCALC 106 (H) 01/17/2018   TRIG 96 04/12/2019   No results found for: DDIMER  Radiology/Studies:  DG Chest Portable 1 View  Result Date: 04/12/2019 CLINICAL DATA:  Shortness of breath EXAM: PORTABLE CHEST 1 VIEW COMPARISON:  None. FINDINGS: Moderate bilateral pleural effusions with adjacent atelectasis. Probable mild pulmonary edema. No pneumothorax. Mild cardiomegaly. IMPRESSION: Moderate bilateral pleural effusions with adjacent atelectasis. Probable mild pulmonary edema. Mild cardiomegaly. Electronically Signed   By: Guadlupe Spanish M.D.   On: 04/12/2019 16:52    EKG: Atrial fibrillation with controlled ventricular rate and left bundle branch block  Weights: Filed Weights   04/12/19 1613 04/13/19 0500  Weight: 80 kg 81 kg     Physical Exam: Blood pressure (!) 119/57, pulse 81, temperature 98.2 F (36.8 C), temperature source Axillary, resp. rate (!) 23, height 5' (1.524 m), weight 81 kg, SpO2 96 %. Body mass index is 34.88 kg/m. General: Well developed, well nourished, in no acute distress. Head eyes ears nose throat: Normocephalic, atraumatic, sclera non-icteric, no xanthomas,  nares are without discharge. No apparent thyromegaly and/or mass  Lungs: Normal respiratory effort.  no wheezes, basilar rales, no rhonchi.  Heart: RRR with normal S1 SOFT S2.  3+ aortic murmur gallop, no rub, PMI is normal size and placement, carotid upstroke normal without bruit, jugular venous pressure is normal Abdomen: Soft, non-tender, non-distended with normoactive bowel sounds. No hepatomegaly. No rebound/guarding. No obvious abdominal masses. Abdominal aorta is normal size without bruit Extremities: 1+ edema. no cyanosis, no clubbing, no ulcers  Peripheral : 2+ bilateral upper extremity pulses, 2+ bilateral femoral pulses, 2+ bilateral dorsal pedal pulse Neuro: Alert and oriented. No facial asymmetry. No focal deficit. Moves all extremities spontaneously. Musculoskeletal: Normal muscle tone without kyphosis Psych:  Responds to questions appropriately with a normal affect.    Assessment: 79 year old female with known coronary disease status post apparent coronary bypass graft hypertension hyperlipidemia peripheral vascular disease atrial fibrillation previous stroke with severe aortic valve stenosis and a non-ST elevation myocardial infarction with acute diastolic dysfunction congestive heart failure  Plan: 1.  Intravenous Lasix for pulmonary edema and acute diastolic dysfunction congestive heart failure 2.  Continue heparin for further risk reduction in myocardial infarction and risk reduction in stroke with atrial fibrillation 3.  Consider isosorbide if patient needs for chest discomfort 4.  Continue serial ECG and enzymes to assess for myocardial infarction extent 5.  Further consideration of cardiac catheterization to assess coronary anatomy graft anatomy and further treatment thereof as necessary for further treatment of aortic stenosis myocardial infarction and heart failure  Signed, Lamar Blinks M.D. [facility] Cardiology 04/13/2019, 6:47 AM

## 2019-04-13 NOTE — Consult Note (Signed)
ANTICOAGULATION CONSULT NOTE   Pharmacy Consult for Heparin Infusion Indication: chest pain/ACS  Allergies  Allergen Reactions  . Aspirin Palpitations    Per Pt: Makes heart flutter.    Patient Measurements: Height: 5' (152.4 cm) Weight: 178 lb 9.2 oz (81 kg) IBW/kg (Calculated) : 45.5 Heparin Dosing Weight: 63.8 kg  Vital Signs: Temp: 98.2 F (36.8 C) (03/13 1600) Temp Source: Oral (03/13 1600) BP: 108/88 (03/13 1600) Pulse Rate: 92 (03/13 1600)  Labs: Recent Labs    04/12/19 1727 04/12/19 1942 04/12/19 2142 04/13/19 0005 04/13/19 0541 04/13/19 0723 04/13/19 1427 04/13/19 1629  HGB 11.4*  --   --   --  12.3  --   --   --   HCT 34.5*  --   --   --  37.3  --   --   --   PLT 201  --   --   --  188  --   --   --   APTT  --   --  33  --   --  132*  --  89*  LABPROT  --   --  15.3*  --   --   --   --   --   INR  --   --  1.2  --   --   --   --   --   HEPARINUNFRC  --   --  >3.60*  --   --  >3.60*  --   --   CREATININE 1.20*  --   --   --  0.95  --   --   --   TROPONINIHS 560*   < > 3,585* 5,085*  --   --  4,356*  --    < > = values in this interval not displayed.    Estimated Creatinine Clearance: 45.3 mL/min (by C-G formula based on SCr of 0.95 mg/dL).   Medical History: Past Medical History:  Diagnosis Date  . A-fib ([facility])   . Stroke [facility])     Medications:  Apixaban 2.5 mg BID prior to admission (last dose 3/12 @ 1200)  Assessment: Patient is a 79 y/o F with a past medical history including Afib and stroke who presented with shortness of breath. Troponins elevated to 560. Pharmacy has been consulted to initiate heparin for ACS.   Baseline CBC significant for mild anemia (Hgb 11.4), Plt WNL. Baseline coags pending.   -Heparin 1500 unit bolus IV x 1 followed by maintenance infusion at 750 units/hr -aPTT in 8 hours. Will follow aPTTs for now in view of apixaban interference with heparin level  3/13 @ 0723 aPTT 132. Per RN, lab drew blood and had RN pause  the pump prior to drawing. Will decrease Heparin to 650 units/hr  Goal of Therapy:  aPTT 66-102 seconds Monitor platelets by anticoagulation protocol: Yes   Plan:  3/13 @ 1629 aPTT 89 sec. Will continue current rate of Heparin infusion of 650 units/hr and and f/u aPTT in 8 hours -Daily CBC per protocol  Bettey Costa, PharmD Clinical Pharmacist 04/13/2019 5:15 PM

## 2019-04-13 NOTE — Consult Note (Signed)
ANTICOAGULATION CONSULT NOTE   Pharmacy Consult for Heparin Infusion Indication: chest pain/ACS  Allergies  Allergen Reactions  . Aspirin Palpitations    Per Pt: Makes heart flutter.    Patient Measurements: Height: 5' (152.4 cm) Weight: 178 lb 9.2 oz (81 kg) IBW/kg (Calculated) : 45.5 Heparin Dosing Weight: 63.8 kg  Vital Signs: Temp: 97.2 F (36.2 C) (03/13 0719) Temp Source: Axillary (03/13 0719) BP: 113/94 (03/13 0704) Pulse Rate: 88 (03/13 0719)  Labs: Recent Labs    04/12/19 1727 04/12/19 1727 04/12/19 1942 04/12/19 2142 04/13/19 0005 04/13/19 0541 04/13/19 0723  HGB 11.4*  --   --   --   --  12.3  --   HCT 34.5*  --   --   --   --  37.3  --   PLT 201  --   --   --   --  188  --   APTT  --   --   --  33  --   --  132*  LABPROT  --   --   --  15.3*  --   --   --   INR  --   --   --  1.2  --   --   --   HEPARINUNFRC  --   --   --  >3.60*  --   --   --   CREATININE 1.20*  --   --   --   --  0.95  --   TROPONINIHS 560*   < > 2,523* 3,585* 5,085*  --   --    < > = values in this interval not displayed.    Estimated Creatinine Clearance: 45.3 mL/min (by C-G formula based on SCr of 0.95 mg/dL).   Medical History: Past Medical History:  Diagnosis Date  . A-fib ([facility])   . Stroke [facility])     Medications:  Apixaban 2.5 mg BID prior to admission (last dose 3/12 @ 1200)  Assessment: Patient is a 79 y/o F with a past medical history including Afib and stroke who presented with shortness of breath. Troponins elevated to 560. Pharmacy has been consulted to initiate heparin for ACS.   Baseline CBC significant for mild anemia (Hgb 11.4), Plt WNL. Baseline coags pending.   -Heparin 1500 unit bolus IV x 1 followed by maintenance infusion at 750 units/hr -aPTT in 8 hours. Will follow aPTTs for now in view of apixaban interference with heparin level  Goal of Therapy:  aPTT 66-102 seconds Monitor platelets by anticoagulation protocol: Yes   Plan:  3/13 @ 0723 aPTT  132. Per RN, lab drew blood and had RN pause the pump prior to drawing. Will decrease Heparin to 650 units/hr and f/u aPTT in 8 hours -Daily CBC per protocol  Bari Mantis PharmD Clinical Pharmacist 04/13/2019

## 2019-04-14 LAB — ECHOCARDIOGRAM COMPLETE
Height: 60 in
Weight: 2857.16 oz

## 2019-04-14 LAB — BASIC METABOLIC PANEL
Anion gap: 14 (ref 5–15)
BUN: 18 mg/dL (ref 8–23)
CO2: 26 mmol/L (ref 22–32)
Calcium: 8.8 mg/dL — ABNORMAL LOW (ref 8.9–10.3)
Chloride: 101 mmol/L (ref 98–111)
Creatinine, Ser: 0.86 mg/dL (ref 0.44–1.00)
GFR calc Af Amer: >60 mL/min (ref >60)
GFR calc non Af Amer: >60 mL/min (ref >60)
Glucose, Bld: 104 mg/dL — ABNORMAL HIGH (ref 70–99)
Potassium: 3.3 mmol/L — ABNORMAL LOW (ref 3.5–5.1)
Sodium: 141 mmol/L (ref 135–145)

## 2019-04-14 LAB — CBC WITH DIFFERENTIAL/PLATELET
Abs Immature Granulocytes: 0.03 10*3/uL (ref 0.00–0.07)
Basophils Absolute: 0.1 10*3/uL (ref 0.0–0.1)
Basophils Relative: 1 %
Eosinophils Absolute: 0.2 10*3/uL (ref 0.0–0.5)
Eosinophils Relative: 3 %
HCT: 35.4 % — ABNORMAL LOW (ref 36.0–46.0)
Hemoglobin: 11.7 g/dL — ABNORMAL LOW (ref 12.0–15.0)
Immature Granulocytes: 0 %
Lymphocytes Relative: 12 %
Lymphs Abs: 1.1 10*3/uL (ref 0.7–4.0)
MCH: 31.6 pg (ref 26.0–34.0)
MCHC: 33.1 g/dL (ref 30.0–36.0)
MCV: 95.7 fL (ref 80.0–100.0)
Monocytes Absolute: 1 10*3/uL (ref 0.1–1.0)
Monocytes Relative: 11 %
Neutro Abs: 6.1 10*3/uL (ref 1.7–7.7)
Neutrophils Relative %: 73 %
Platelets: 208 10*3/uL (ref 150–400)
RBC: 3.7 MIL/uL — ABNORMAL LOW (ref 3.87–5.11)
RDW: 14.1 % (ref 11.5–15.5)
WBC: 8.5 10*3/uL (ref 4.0–10.5)
nRBC: 0.2 % (ref 0.0–0.2)

## 2019-04-14 LAB — APTT
aPTT: 80 seconds — ABNORMAL HIGH (ref 24–36)
aPTT: 82 seconds — ABNORMAL HIGH (ref 24–36)

## 2019-04-14 MED ORDER — POTASSIUM CHLORIDE CRYS ER 20 MEQ PO TBCR
40.0000 meq | EXTENDED_RELEASE_TABLET | ORAL | Status: AC
  Administered 2019-04-14 (×2): 40 meq via ORAL
  Filled 2019-04-14: qty 2

## 2019-04-14 MED ORDER — MUSCLE RUB 10-15 % EX CREA
TOPICAL_CREAM | CUTANEOUS | Status: DC | PRN
  Filled 2019-04-14: qty 85

## 2019-04-14 MED ORDER — IPRATROPIUM-ALBUTEROL 0.5-2.5 (3) MG/3ML IN SOLN: 3.0000 mL | RESPIRATORY_TRACT | Status: DC | PRN

## 2019-04-14 MED ORDER — SODIUM CHLORIDE 0.9% FLUSH
3.0000 mL | Freq: Two times a day (BID) | INTRAVENOUS | Status: DC
  Administered 2019-04-14 – 2019-04-17 (×5): 3 mL via INTRAVENOUS

## 2019-04-14 MED ORDER — POTASSIUM CHLORIDE CRYS ER 20 MEQ PO TBCR
20.0000 meq | EXTENDED_RELEASE_TABLET | Freq: Two times a day (BID) | ORAL | Status: DC
  Administered 2019-04-15 – 2019-04-17 (×4): 20 meq via ORAL
  Filled 2019-04-14 (×4): qty 1

## 2019-04-14 NOTE — Progress Notes (Signed)
[facility] Encounter Note  Patient: Valerie Trujillo / Admit Date: 04/12/2019 / Date of Encounter: 04/14/2019, 8:09 AM   Subjective: Patient much better today with less congestive heart failure pulmonary edema shortness of breath and no current evidence of chest pain.  Patient has had a non-ST elevation myocardial infarction with a troponin of 5085. Echocardiogram showing normal LV systolic function with ejection fraction of 55% with the severe aortic stenosis and a peak velocity of 77 mm mean velocity of 42 mm and gradient of 4.4 m/s Patient does have atrial fibrillation with controlled ventricular rate and bundle branch block unchanged from before  Review of Systems: Positive for: Shortness of breath Negative for: Vision change, hearing change, syncope, dizziness, nausea, vomiting,diarrhea, bloody stool, stomach pain, cough, congestion, diaphoresis, urinary frequency, urinary pain,skin lesions, skin rashes Others previously listed  Objective: Telemetry: Atrial fibrillation with bundle branch block Physical Exam: Blood pressure 140/64, pulse 89, temperature 98.1 F (36.7 C), temperature source Oral, resp. rate 17, height 5' (1.524 m), weight 80 kg, SpO2 100 %. Body mass index is 34.44 kg/m. General: Well developed, well nourished, in no acute distress. Head: Normocephalic, atraumatic, sclera non-icteric, no xanthomas, nares are without discharge. Neck: No apparent masses Lungs: Normal respirations with few wheezes, no rhonchi, no rales , no crackles   Heart: Irregular rate and rhythm, normal S1 SOFT S2, 3+ aortic murmur, no rub, no gallop, PMI is normal size and placement, carotid upstroke normal without bruit, jugular venous pressure normal Abdomen: Soft, non-tender, non-distended with normoactive bowel sounds. No hepatosplenomegaly. Abdominal aorta is normal size without bruit Extremities: Trace edema, no clubbing, no cyanosis, no ulcers,  Peripheral: 2+ radial, 2+  femoral, 1+ dorsal pedal pulses Neuro: Alert and oriented. Moves all extremities spontaneously. Psych:  Responds to questions appropriately with a normal affect.   Intake/Output Summary (Last 24 hours) at 04/14/2019 0809 Last data filed at 04/14/2019 0624 Gross per 24 hour  Intake 143.99 ml  Output 2700 ml  Net -2556.01 ml    Inpatient Medications:  . atorvastatin  40 mg Oral Daily  . carvedilol  25 mg Oral BID WC  . Chlorhexidine Gluconate Cloth  6 each Topical Daily  . cloNIDine  0.1 mg Transdermal Weekly  . furosemide  60 mg Intravenous Q8H  . mirtazapine  15 mg Oral QHS  . [START ON 04/15/2019] potassium chloride  20 mEq Oral BID  . potassium chloride  40 mEq Oral Q2H  . sodium chloride flush  3 mL Intravenous Q12H   Infusions:  . sodium chloride    . heparin 650 Units/hr (04/14/19 0624)    Labs: Recent Labs    04/12/19 1727 04/12/19 2142 04/13/19 0541 04/14/19 0504  NA   < >  --  142 141  K   < >  --  3.6 3.3*  CL   < >  --  106 101  CO2   < >  --  26 26  GLUCOSE   < >  --  98 104*  BUN   < >  --  22 18  CREATININE   < >  --  0.95 0.86  CALCIUM   < >  --  8.8* 8.8*  MG  --  1.8  --   --    < > = values in this interval not displayed.   Recent Labs    04/12/19 1727  AST 26  ALT 14  ALKPHOS 62  BILITOT 1.6*  PROT 7.3  ALBUMIN 3.6   Recent Labs    04/13/19 0541 04/14/19 0504  WBC 9.7 8.5  NEUTROABS 7.4 6.1  HGB 12.3 11.7*  HCT 37.3 35.4*  MCV 95.6 95.7  PLT 188 208   No results for input(s): CKTOTAL, CKMB, TROPONINI in the last 72 hours. Invalid input(s): POCBNP No results for input(s): HGBA1C in the last 72 hours.   Weights: Filed Weights   04/12/19 1613 04/13/19 0500 04/14/19 0500  Weight: 80 kg 81 kg 80 kg     Radiology/Studies:  US Venous Img Lower Bilateral (DVT)  Result Date: 04/13/2019 CLINICAL DATA:  Lower extremity edema for years. Patient is on Eliquis. EXAM: BILATERAL LOWER EXTREMITY VENOUS DOPPLER ULTRASOUND TECHNIQUE:  Gray-scale sonography with compression, as well as color and duplex ultrasound, were performed to evaluate the deep venous system(s) from the level of the common femoral vein through the popliteal and proximal calf veins. COMPARISON:  None. FINDINGS: VENOUS Normal compressibility of the common femoral, superficial femoral, and popliteal veins, as well as the visualized calf veins. Visualized portions of profunda femoral vein and great saphenous vein unremarkable. No filling defects to suggest DVT on grayscale or color Doppler imaging. Doppler waveforms show normal direction of venous flow, normal respiratory phasicity and response to augmentation. Limited views of the contralateral common femoral vein are unremarkable. OTHER None. Limitations: none IMPRESSION: No femoropopliteal DVT nor evidence of DVT within the visualized calf veins. If clinical symptoms are inconsistent or if there are persistent or worsening symptoms, further imaging (possibly involving the iliac veins) may be warranted. Electronically Signed   By: Amie Portland M.D.   On: 04/13/2019 16:01   DG Chest Port 1 View  Result Date: 04/13/2019 CLINICAL DATA:  SOB per physician order. Per physician notes - Patient presented with complaints of cough and shortness of breath. Found to have non-STEMI and acute hypoxic respiratory failure.Currently further plan is continue aggressive diuresis. Pt admitted yesterday for acute non-STEMI, acute on chronic possibly diastolic CHF, acute hypoxic respiratory failure. Hx - A-fib, stroke, CABG 1980, former smoker. EXAM: PORTABLE CHEST 1 VIEW COMPARISON:  04/12/2019 FINDINGS: Stable cardiomegaly. Moderate, left greater than right, pleural effusions. There are linear opacities in the left mid lung most likely atelectasis. No new lung abnormalities.  No pneumothorax. IMPRESSION: 1. Cardiomegaly with moderate bilateral pleural effusions, left greater than right, associated with atelectasis. Underlying pneumonia is  not excluded. No convincing pulmonary edema. Electronically Signed   By: Amie Portland M.D.   On: 04/13/2019 16:00   DG Chest Portable 1 View  Result Date: 04/12/2019 CLINICAL DATA:  Shortness of breath EXAM: PORTABLE CHEST 1 VIEW COMPARISON:  None. FINDINGS: Moderate bilateral pleural effusions with adjacent atelectasis. Probable mild pulmonary edema. No pneumothorax. Mild cardiomegaly. IMPRESSION: Moderate bilateral pleural effusions with adjacent atelectasis. Probable mild pulmonary edema. Mild cardiomegaly. Electronically Signed   By: Guadlupe Spanish M.D.   On: 04/12/2019 16:52   ECHOCARDIOGRAM COMPLETE  Result Date: 04/14/2019    ECHOCARDIOGRAM REPORT   Patient Name:   RAVYNN HOGATE Date of Exam: 04/13/2019 Medical Rec #:  [id number]   Height:       60.0 in Accession #:    [id number]  Weight:       178.6 lb Date of Birth:  03-25-1940   BSA:          1.779 m Patient Age:    78 years    BP:           137/75 mmHg Patient Gender: F  HR:           84 bpm. Exam Location:  [facility] Procedure: 2D Echo Indications:     Acute Diastolic CHF  History:         Patient has prior history of Echocardiogram examinations. Prior                  CABG, Stroke; Arrythmias:Atrial Fibrillation.  Sonographer:     LTM Referring Phys:  [id number] Vernetta Honey MANSY Diagnosing Phys: Arnoldo Hooker MD IMPRESSIONS  1. Left ventricular ejection fraction, by estimation, is 60 to 65%. The left ventricle has normal function. The left ventricle has no regional wall motion abnormalities. There is moderate left ventricular hypertrophy. Left ventricular diastolic parameters are consistent with Grade I diastolic dysfunction (impaired relaxation).  2. Right ventricular systolic function is normal. The right ventricular size is normal. There is moderately elevated pulmonary artery systolic pressure.  3. Left atrial size was moderately dilated.  4. Right atrial size was moderately dilated.  5. The mitral valve is normal in structure. Moderate mitral  valve regurgitation.  6. Tricuspid valve regurgitation is moderate.  7. The aortic valve is grossly normal. Aortic valve regurgitation is mild. Severe aortic valve stenosis. FINDINGS  Left Ventricle: Left ventricular ejection fraction, by estimation, is 60 to 65%. The left ventricle has normal function. The left ventricle has no regional wall motion abnormalities. The left ventricular internal cavity size was normal in size. There is  moderate left ventricular hypertrophy. Left ventricular diastolic parameters are consistent with Grade I diastolic dysfunction (impaired relaxation). Right Ventricle: The right ventricular size is normal. No increase in right ventricular wall thickness. Right ventricular systolic function is normal. There is moderately elevated pulmonary artery systolic pressure. The tricuspid regurgitant velocity is 2.81 m/s, and with an assumed right atrial pressure of 10 mmHg, the estimated right ventricular systolic pressure is 41.6 mmHg. Left Atrium: Left atrial size was moderately dilated. Right Atrium: Right atrial size was moderately dilated. Pericardium: There is no evidence of pericardial effusion. Mitral Valve: The mitral valve is normal in structure. Moderate mitral valve regurgitation. MV peak gradient, 13.5 mmHg. The mean mitral valve gradient is 5.0 mmHg. Tricuspid Valve: The tricuspid valve is normal in structure. Tricuspid valve regurgitation is moderate. Aortic Valve: The aortic valve is grossly normal. Aortic valve regurgitation is mild. Aortic regurgitation PHT measures 477 msec. Severe aortic stenosis is present. Aortic valve mean gradient measures 53.5 mmHg. Aortic valve peak gradient measures 100.8 mmHg. Aortic valve area, by VTI measures 0.53 cm. Pulmonic Valve: The pulmonic valve was normal in structure. Pulmonic valve regurgitation is not visualized. Aorta: The aortic root and ascending aorta are structurally normal, with no evidence of dilitation. IAS/Shunts: No atrial level  shunt detected by color flow Doppler.  LEFT VENTRICLE PLAX 2D LVIDd:         4.20 cm  Diastology LVIDs:         2.56 cm  LV e' lateral:   4.79 cm/s LV PW:         1.30 cm  LV E/e' lateral: 31.9 LV IVS:        1.42 cm  LV e' medial:    3.37 cm/s LVOT diam:     2.10 cm  LV E/e' medial:  45.4 LV SV:         53 LV SV Index:   30 LVOT Area:     3.46 cm  LEFT ATRIUM  Index       RIGHT ATRIUM           Index LA Vol (A2C):   115.0 ml 64.65 ml/m RA Area:     35.80 cm LA Vol (A4C):   96.8 ml  54.42 ml/m RA Volume:   138.00 ml 77.58 ml/m LA Biplane Vol: 112.0 ml 62.96 ml/m  AORTIC VALVE AV Area (Vmax):    0.56 cm AV Area (Vmean):   0.61 cm AV Area (VTI):     0.53 cm AV Vmax:           502.00 cm/s AV Vmean:          337.500 cm/s AV VTI:            1.000 m AV Peak Grad:      100.8 mmHg AV Mean Grad:      53.5 mmHg LVOT Vmax:         80.50 cm/s LVOT Vmean:        59.000 cm/s LVOT VTI:          0.152 m LVOT/AV VTI ratio: 0.15 AI PHT:            477 msec  AORTA Ao Root diam: 3.60 cm MITRAL VALVE                TRICUSPID VALVE MV Area (PHT): 2.87 cm     TR Peak grad:   31.6 mmHg MV Peak grad:  13.5 mmHg    TR Vmax:        281.00 cm/s MV Mean grad:  5.0 mmHg MV Vmax:       1.84 m/s     SHUNTS MV Vmean:      108.0 cm/s   Systemic VTI:  0.15 m MV Decel Time: 264 msec     Systemic Diam: 2.10 cm MV E velocity: 153.00 cm/s Arnoldo Hooker MD Electronically signed by Arnoldo Hooker MD Signature Date/Time: 04/14/2019/7:22:57 AM    Final      Assessment and Recommendation  79 y.o. female with known coronary artery disease status post coronary bypass graft hypertension hyperlipidemia atrial fibrillation bundle branch block peripheral vascular disease hypertension with a non-ST elevation myocardial infarction and diastolic dysfunction congestive heart failure now slightly improved 1.  Continue heparin for further risk reduction in stroke with atrial fibrillation and myocardial infarction 2.  Carvedilol for heart rate  control and congestive heart failure 3.  Furosemide for congestive heart failure multifactorial in nature 4.  Proceed to cardiac catheterization to assess coronary anatomy and further treatment thereof is necessary.  Patient understands the risk and benefits of cardiac catheterization.  This includes the possibility of death stroke heart attack infection bleeding or blood clot.  She is at low risk for conscious sedation  Signed, Arnoldo Hooker M.D. FACC

## 2019-04-14 NOTE — Progress Notes (Signed)
CH received OR for prayer for pt. When Ch was able to visit at 9pm, Pt asleep.  CH will refer to Monday chaplains and remains available as needed.    04/14/19 2100  Clinical Encounter Type  Visited With Patient not available;Health care provider  Visit Type Initial  Referral From Nurse  Consult/Referral To Chaplain  Spiritual Encounters  Spiritual Needs Prayer

## 2019-04-14 NOTE — Progress Notes (Signed)
[facility] Progress Note  Patient: Valerie Trujillo    CVE:[id number]  DOA: 04/12/2019     Date of Service: the patient was seen and examined on 04/14/2019  Chief Complaint  Patient presents with  . Shortness of Breath   Brief hospital course: CAD SP CABG, HTN, HLD, PVD, CVA, chronic A. fib, severe aortic stenosis.  Patient presented with complaints of cough and shortness of breath.  Found to have non-STEMI and acute hypoxic respiratory failure. Currently further plan is continue further cardiac work-up and diuresis  Assessment and Plan: 1.  Acute non-STEMI. 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 Acute hypoxic respiratory failure. Aortic stenosis/severe. History of CAD SP CABG protection essential hypertension. Dyslipidemia. PVD Cardiology consulted. EKG shows evidence of LBBB. Cardiology currently not planning any intervention today. Currently on IV heparin. Reducing clonidine dose to allow for aggressive diuresis. IV Lasix dose increased from 40 mg twice daily to 60 mg 3 times daily.   D-dimer elevated currently on therapeutic anticoagulation.  Negative Doppler for DVT Continue Lipitor. Continue Coreg. Echocardiogram currently pending. To allow for aggressive diuresis blood pressure medication adjusted. Currently Norvasc is on hold, clonidine reduced from 0.2 mg twice daily 2.1 mg twice daily. Currently on lisinopril 40 mg daily. Metoprolol 100 mg daily transition to Coreg 25 mg twice daily. Cardiac catheterization scheduled for tomorrow per cardiology.  2.  Anxiety As needed Xanax.  3.   hematuria Overactive bladder Still has some mild pink-tinged urine. Holding Myrbetriq for now. H&H stable. Monitor. We will consult urology tomorrow.  4.  Hypokalemia Corrected  5.  Acute kidney injury Likely cardiorenal hemodynamics. Improving with diuretics. Monitor closely.  6.  Chronic anticoagulation On Eliquis. Currently on IV heparin.  Monitor.  Body mass index  is 34.44 kg/m.    Interventions:        Diet: Cardiac diet, n.p.o. after midnight for cardiac catheterization DVT Prophylaxis: Therapeutic Anticoagulation with Heparin, Eliquis on hold   Advance goals of care discussion: Full code  Family Communication: Niece family was present at bedside, at the time of interview.  Patient provided permission.  All questions answered satisfactorily.  Disposition:  Pt is from home, admitted with non-STEMI, still has severe hypoxia on 10 LPM, which precludes a safe discharge. Discharge to home, when medically stable.  Subjective: Shortness of breath is still present but improving.  No nausea no vomiting.  No chest pain or chest tightness.  No cough.  No fever no chills.  No bleeding on my second evaluation but initially had some pink tinge is urine.  Physical Exam: General:  alert oriented to time, place, and person.  Appear in moderate distress, affect appropriate Eyes: PERRL ENT: Oral Mucosa Clear, moist  Neck: no JVD,  Cardiovascular: S1 and S2 Present, no Murmur,  Respiratory: good respiratory effort, Bilateral Air entry equal and Decreased, bilateral  Crackles, no wheezes Abdomen: Bowel Sound present, Soft and no tenderness,  Skin: no rash Extremities: bilateral  Pedal edema, no calf tenderness Neurologic: without any new focal findings Gait not checked due to patient safety concerns  Vitals:   04/14/19 0600 04/14/19 0800 04/14/19 1000 04/14/19 1808  BP:  114/71 92/61 113/84  Pulse: 89 97 90 95  Resp: 17 20 (!) 22 17  Temp:  98 F (36.7 C)  97.8 F (36.6 C)  TempSrc:  Axillary    SpO2: 100% 100% 96% 99%  Weight:      Height:        Intake/Output Summary (Last 24 hours)  at 04/14/2019 1914 Last data filed at 04/14/2019 1500 Gross per 24 hour  Intake 72.98 ml  Output 3025 ml  Net -2952.02 ml   Filed Weights   04/12/19 1613 04/13/19 0500 04/14/19 0500  Weight: 80 kg 81 kg 80 kg    Data Reviewed: I have personally reviewed  and interpreted daily labs, tele strips, imagings as discussed above. I reviewed all nursing notes, pharmacy notes, vitals, pertinent old records I have discussed plan of care as described above with RN and patient/family.  CBC: Recent Labs  Lab 04/12/19 1727 04/13/19 0541 04/14/19 0504  WBC 8.8 9.7 8.5  NEUTROABS 7.6 7.4 6.1  HGB 11.4* 12.3 11.7*  HCT 34.5* 37.3 35.4*  MCV 95.6 95.6 95.7  PLT 201 188 208   Basic Metabolic Panel: Recent Labs  Lab 04/12/19 1727 04/12/19 2142 04/13/19 0541 04/14/19 0504  NA 139  --  142 141  K 3.2*  --  3.6 3.3*  CL 106  --  106 101  CO2 22  --  26 26  GLUCOSE 141*  --  98 104*  BUN 22  --  22 18  CREATININE 1.20*  --  0.95 0.86  CALCIUM 8.8*  --  8.8* 8.8*  MG  --  1.8  --   --     Studies: No results found.  Scheduled Meds: . atorvastatin  40 mg Oral Daily  . carvedilol  25 mg Oral BID WC  . Chlorhexidine Gluconate Cloth  6 each Topical Daily  . cloNIDine  0.1 mg Transdermal Weekly  . furosemide  60 mg Intravenous Q8H  . mirtazapine  15 mg Oral QHS  . [START ON 04/15/2019] potassium chloride  20 mEq Oral BID  . sodium chloride flush  3 mL Intravenous Q12H  . sodium chloride flush  3 mL Intravenous Q12H   Continuous Infusions: . sodium chloride    . heparin 650 Units/hr (04/14/19 0624)   PRN Meds: sodium chloride, acetaminophen, ALPRAZolam, ipratropium-albuterol, Muscle Rub, nitroGLYCERIN, ondansetron (ZOFRAN) IV, senna-docusate, sodium chloride flush, zolpidem  Time spent: 35 minutes  Author: Lynden Oxford, MD [facility] 04/14/2019 7:14 PM  To reach On-call, see care teams to locate the attending and reach out to them via [email or web address]. If 7PM-7AM, please contact night-coverage If you still have difficulty reaching the attending provider, please page the [facility] (Director on Call) for [facility] on amion for assistance.

## 2019-04-14 NOTE — Consult Note (Signed)
[place] for Heparin Infusion Indication: chest pain/ACS  Allergies  Allergen Reactions  . Aspirin Palpitations    Per Pt: Makes heart flutter.    Patient Measurements: Height: 5' (152.4 cm) Weight: 178 lb 9.2 oz (81 kg) IBW/kg (Calculated) : 45.5 Heparin Dosing Weight: 63.8 kg  Vital Signs: Temp: 98.1 F (36.7 C) (03/13 2000) Temp Source: Oral (03/13 2000) BP: 140/64 (03/14 0500) Pulse Rate: 89 (03/14 0600)  Labs: Recent Labs    04/12/19 1727 04/12/19 1727 04/12/19 1942 04/12/19 2142 04/12/19 2142 04/13/19 0005 04/13/19 0541 04/13/19 0723 04/13/19 0723 04/13/19 1427 04/13/19 1623 04/13/19 1629 04/14/19 0022 04/14/19 0504  HGB 11.4*   < >  --   --   --   --  12.3  --   --   --   --   --   --  11.7*  HCT 34.5*  --   --   --   --   --  37.3  --   --   --   --   --   --  35.4*  PLT 201  --   --   --   --   --  188  --   --   --   --   --   --  208  APTT  --   --   --  33   < >  --   --  132*   < >  --   --  89* 82* 80*  LABPROT  --   --   --  15.3*  --   --   --   --   --   --   --   --   --   --   INR  --   --   --  1.2  --   --   --   --   --   --   --   --   --   --   HEPARINUNFRC  --   --   --  >3.60*  --   --   --  >3.60*  --   --   --   --   --   --   CREATININE 1.20*  --   --   --   --   --  0.95  --   --   --   --   --   --  0.86  TROPONINIHS 560*  --    < > 3,585*   < > 5,085*  --   --   --  4,356* 3,641*  --   --   --    < > = values in this interval not displayed.    Estimated Creatinine Clearance: 50 mL/min (by C-G formula based on SCr of 0.86 mg/dL).   Medical History: Past Medical History:  Diagnosis Date  . A-fib ([place])   . Stroke [facility])     Medications:  Apixaban 2.5 mg BID prior to admission (last dose 3/12 @ 1200)  Assessment: Patient is a 79 y/o F with a past medical history including Afib and stroke who presented with shortness of breath. Troponins elevated to 560. Pharmacy has been consulted to  initiate heparin for ACS.   Baseline CBC significant for mild anemia (Hgb 11.4), Plt WNL. Baseline coags pending.   -Heparin 1500 unit bolus IV x 1 followed by maintenance infusion at 750 units/hr -aPTT in 8 hours.  Will follow aPTTs for now in view of apixaban interference with heparin level  3/13 @ 0723 aPTT 132. Per RN, lab drew blood and had RN pause the pump prior to drawing. Will decrease Heparin to 650 units/hr  Goal of Therapy:  aPTT 66-102 seconds Monitor platelets by anticoagulation protocol: Yes   Plan:  3/15 @ 0500 aPTT 80 sec. Will continue current rate of Heparin infusion of 650 units/hr and recheck aPTT w/ am labs and continue to monitor.  Thomasene Ripple, PharmD Clinical Pharmacist 04/14/2019 6:38 AM

## 2019-04-14 NOTE — Consult Note (Signed)
ANTICOAGULATION CONSULT NOTE   Pharmacy Consult for Heparin Infusion Indication: chest pain/ACS  Allergies  Allergen Reactions  . Aspirin Palpitations    Per Pt: Makes heart flutter.    Patient Measurements: Height: 5' (152.4 cm) Weight: 178 lb 9.2 oz (81 kg) IBW/kg (Calculated) : 45.5 Heparin Dosing Weight: 63.8 kg  Vital Signs: Temp: 98.1 F (36.7 C) (03/13 2000) Temp Source: Oral (03/13 2000) BP: 111/63 (03/14 0000) Pulse Rate: 92 (03/14 0030)  Labs: Recent Labs    04/12/19 1727 04/12/19 1942 04/12/19 2142 04/12/19 2142 04/13/19 0005 04/13/19 0541 04/13/19 0723 04/13/19 1427 04/13/19 1623 04/13/19 1629 04/14/19 0022  HGB 11.4*  --   --   --   --  12.3  --   --   --   --   --   HCT 34.5*  --   --   --   --  37.3  --   --   --   --   --   PLT 201  --   --   --   --  188  --   --   --   --   --   APTT  --   --  33   < >  --   --  132*  --   --  89* 82*  LABPROT  --   --  15.3*  --   --   --   --   --   --   --   --   INR  --   --  1.2  --   --   --   --   --   --   --   --   HEPARINUNFRC  --   --  >3.60*  --   --   --  >3.60*  --   --   --   --   CREATININE 1.20*  --   --   --   --  0.95  --   --   --   --   --   TROPONINIHS 560*   < > 3,585*   < > 5,085*  --   --  4,356* 3,641*  --   --    < > = values in this interval not displayed.    Estimated Creatinine Clearance: 45.3 mL/min (by C-G formula based on SCr of 0.95 mg/dL).   Medical History: Past Medical History:  Diagnosis Date  . A-fib ([facility])   . Stroke [facility])     Medications:  Apixaban 2.5 mg BID prior to admission (last dose 3/12 @ 1200)  Assessment: Patient is a 79 y/o F with a past medical history including Afib and stroke who presented with shortness of breath. Troponins elevated to 560. Pharmacy has been consulted to initiate heparin for ACS.   Baseline CBC significant for mild anemia (Hgb 11.4), Plt WNL. Baseline coags pending.   -Heparin 1500 unit bolus IV x 1 followed by maintenance  infusion at 750 units/hr -aPTT in 8 hours. Will follow aPTTs for now in view of apixaban interference with heparin level  3/13 @ 0723 aPTT 132. Per RN, lab drew blood and had RN pause the pump prior to drawing. Will decrease Heparin to 650 units/hr  Goal of Therapy:  aPTT 66-102 seconds Monitor platelets by anticoagulation protocol: Yes   Plan:  3/14 @ 0030 aPTT 82 sec. Will continue current rate of Heparin infusion of 650 units/hr and recheck aPTT w/ am labs  and continue to monitor.  Tobie Lords, PharmD Clinical Pharmacist 04/14/2019 1:39 AM

## 2019-04-15 ENCOUNTER — Encounter
Admission: EM | Disposition: A | Discharge status: Skilled Nursing Facility | Payer: Self-pay | Source: Home / Self Care | Attending: Internal Medicine

## 2019-04-15 HISTORY — PX: RIGHT HEART CATH: CATH118263

## 2019-04-15 HISTORY — PX: LEFT HEART CATH AND CORS/GRAFTS ANGIOGRAPHY: CATH118250

## 2019-04-15 LAB — CBC WITH DIFFERENTIAL/PLATELET
Abs Immature Granulocytes: 0.05 10*3/uL (ref 0.00–0.07)
Basophils Absolute: 0.1 10*3/uL (ref 0.0–0.1)
Basophils Relative: 1 %
Eosinophils Absolute: 0.1 10*3/uL (ref 0.0–0.5)
Eosinophils Relative: 1 %
HCT: 35 % — ABNORMAL LOW (ref 36.0–46.0)
Hemoglobin: 11.7 g/dL — ABNORMAL LOW (ref 12.0–15.0)
Immature Granulocytes: 1 %
Lymphocytes Relative: 14 %
Lymphs Abs: 1.3 10*3/uL (ref 0.7–4.0)
MCH: 32.2 pg (ref 26.0–34.0)
MCHC: 33.4 g/dL (ref 30.0–36.0)
MCV: 96.4 fL (ref 80.0–100.0)
Monocytes Absolute: 1 10*3/uL (ref 0.1–1.0)
Monocytes Relative: 11 %
Neutro Abs: 6.3 10*3/uL (ref 1.7–7.7)
Neutrophils Relative %: 72 %
Platelets: 212 10*3/uL (ref 150–400)
RBC: 3.63 MIL/uL — ABNORMAL LOW (ref 3.87–5.11)
RDW: 14.5 % (ref 11.5–15.5)
WBC: 8.8 10*3/uL (ref 4.0–10.5)
nRBC: 0 % (ref 0.0–0.2)

## 2019-04-15 LAB — BASIC METABOLIC PANEL
Anion gap: 10 (ref 5–15)
BUN: 19 mg/dL (ref 8–23)
CO2: 33 mmol/L — ABNORMAL HIGH (ref 22–32)
Calcium: 9.1 mg/dL (ref 8.9–10.3)
Chloride: 98 mmol/L (ref 98–111)
Creatinine, Ser: 0.97 mg/dL (ref 0.44–1.00)
GFR calc Af Amer: >60 mL/min (ref >60)
GFR calc non Af Amer: 56 mL/min — ABNORMAL LOW (ref >60)
Glucose, Bld: 116 mg/dL — ABNORMAL HIGH (ref 70–99)
Potassium: 3.5 mmol/L (ref 3.5–5.1)
Sodium: 141 mmol/L (ref 135–145)

## 2019-04-15 LAB — GLUCOSE, CAPILLARY: Glucose-Capillary: 142 mg/dL — ABNORMAL HIGH (ref 70–99)

## 2019-04-15 LAB — HEPARIN LEVEL (UNFRACTIONATED): Heparin Unfractionated: 1.46 IU/mL — ABNORMAL HIGH (ref 0.30–0.70)

## 2019-04-15 LAB — APTT: aPTT: 88 seconds — ABNORMAL HIGH (ref 24–36)

## 2019-04-15 SURGERY — LEFT HEART CATH AND CORS/GRAFTS ANGIOGRAPHY
Anesthesia: Moderate Sedation

## 2019-04-15 MED ORDER — MIDAZOLAM HCL 2 MG/2ML IJ SOLN
INTRAMUSCULAR | Status: AC
  Filled 2019-04-15: qty 2

## 2019-04-15 MED ORDER — SODIUM CHLORIDE 0.9 % WEIGHT BASED INFUSION
1.0000 mL/kg/h | INTRAVENOUS | Status: AC
  Administered 2019-04-15: 1 mL/kg/h via INTRAVENOUS

## 2019-04-15 MED ORDER — FENTANYL CITRATE (PF) 100 MCG/2ML IJ SOLN
INTRAMUSCULAR | Status: DC | PRN
  Administered 2019-04-15 (×2): 25 ug via INTRAVENOUS

## 2019-04-15 MED ORDER — SODIUM CHLORIDE 0.9 % IV SOLN: 250.0000 mL | INTRAVENOUS | Status: DC | PRN

## 2019-04-15 MED ORDER — ONDANSETRON HCL 4 MG/2ML IJ SOLN: 4.0000 mg | Freq: Four times a day (QID) | INTRAMUSCULAR | Status: DC | PRN

## 2019-04-15 MED ORDER — SODIUM CHLORIDE 0.9 % WEIGHT BASED INFUSION: 1.0000 mL/kg/h | INTRAVENOUS | Status: DC

## 2019-04-15 MED ORDER — ASPIRIN 81 MG PO CHEW: 81.0000 mg | CHEWABLE_TABLET | ORAL | Status: DC

## 2019-04-15 MED ORDER — HEPARIN (PORCINE) IN NACL 1000-0.9 UT/500ML-% IV SOLN
INTRAVENOUS | Status: AC
  Filled 2019-04-15: qty 1000

## 2019-04-15 MED ORDER — HYDRALAZINE HCL 20 MG/ML IJ SOLN: 10.0000 mg | INTRAMUSCULAR | Status: AC | PRN

## 2019-04-15 MED ORDER — IOHEXOL 300 MG/ML  SOLN
INTRAMUSCULAR | Status: DC | PRN
  Administered 2019-04-15: 220 mL via INTRA_ARTERIAL

## 2019-04-15 MED ORDER — MIDAZOLAM HCL 2 MG/2ML IJ SOLN
INTRAMUSCULAR | Status: DC | PRN
  Administered 2019-04-15: 1 mg via INTRAVENOUS

## 2019-04-15 MED ORDER — FENTANYL CITRATE (PF) 100 MCG/2ML IJ SOLN
INTRAMUSCULAR | Status: AC
  Filled 2019-04-15: qty 2

## 2019-04-15 MED ORDER — FUROSEMIDE 10 MG/ML IJ SOLN
40.0000 mg | Freq: Two times a day (BID) | INTRAMUSCULAR | Status: DC
  Filled 2019-04-15: qty 4

## 2019-04-15 MED ORDER — LABETALOL HCL 5 MG/ML IV SOLN: 10.0000 mg | INTRAVENOUS | Status: AC | PRN

## 2019-04-15 MED ORDER — ACETAMINOPHEN 325 MG PO TABS
650.0000 mg | ORAL_TABLET | ORAL | Status: DC | PRN
  Administered 2019-04-16: 650 mg via ORAL
  Filled 2019-04-15: qty 2

## 2019-04-15 MED ORDER — HEPARIN (PORCINE) IN NACL 1000-0.9 UT/500ML-% IV SOLN
INTRAVENOUS | Status: DC | PRN
  Administered 2019-04-15: 500 mL

## 2019-04-15 MED ORDER — SODIUM CHLORIDE 0.9 % WEIGHT BASED INFUSION: 3.0000 mL/kg/h | INTRAVENOUS | Status: DC

## 2019-04-15 MED ORDER — SODIUM CHLORIDE 0.9% FLUSH: 3.0000 mL | INTRAVENOUS | Status: DC | PRN

## 2019-04-15 SURGICAL SUPPLY — 19 items
CATH INFINITI 5 FR 3DRC (CATHETERS) ×3 IMPLANT
CATH INFINITI 5 FR IM (CATHETERS) ×3 IMPLANT
CATH INFINITI 5FR ANG PIGTAIL (CATHETERS) ×3 IMPLANT
CATH INFINITI 5FR JL4 (CATHETERS) ×3 IMPLANT
CATH INFINITI JR4 5F (CATHETERS) ×3 IMPLANT
CATH SWANZ 7F THERMO (CATHETERS) ×3 IMPLANT
DEVICE CLOSURE MYNXGRIP 6/7F (Vascular Products) IMPLANT
GUIDEWIRE EMER 3M J .025X150CM (WIRE) ×3 IMPLANT
KIT MANI 3VAL PERCEP (MISCELLANEOUS) ×3 IMPLANT
KIT RIGHT HEART (MISCELLANEOUS) ×3 IMPLANT
NEEDLE PERC 18GX7CM (NEEDLE) ×3 IMPLANT
PACK CARDIAC CATH (CUSTOM PROCEDURE TRAY) ×3 IMPLANT
SHEATH AVANTI 5FR X 11CM (SHEATH) IMPLANT
SHEATH AVANTI 6FR X 11CM (SHEATH) ×3 IMPLANT
SHEATH AVANTI 7FRX11 (SHEATH) ×3 IMPLANT
WIRE EMERALD 3MM-J .035X260CM (WIRE) ×3 IMPLANT
WIRE EMERALD ST .035X150CM (WIRE) ×3 IMPLANT
WIRE GUIDERIGHT .035X150 (WIRE) ×3 IMPLANT
WIRE HITORQ VERSACORE ST 145CM (WIRE) ×3 IMPLANT

## 2019-04-15 NOTE — Progress Notes (Signed)
[facility] Encounter Note  Patient: Valerie Trujillo / Admit Date: 04/12/2019 / Date of Encounter: 04/15/2019, 12:39 PM   Subjective: Patient much better today with less congestive heart failure pulmonary edema shortness of breath and no current evidence of chest pain.  Patient has had a non-ST elevation myocardial infarction with a troponin of 5085. Echocardiogram showing normal LV systolic function with ejection fraction of 55% with the severe aortic stenosis and a peak velocity of 77 mm mean velocity of 42 mm and gradient of 4.4 m/s Patient does have atrial fibrillation with controlled ventricular rate and bundle branch block unchanged from before Cardiac cath with severe aortic stenosis and patetn lima to lad svg to om1 and chronically occluded rca and svg to rca with collateralls seen Lv function normal Review of Systems: Positive for: Shortness of breath Negative for: Vision change, hearing change, syncope, dizziness, nausea, vomiting,diarrhea, bloody stool, stomach pain, cough, congestion, diaphoresis, urinary frequency, urinary pain,skin lesions, skin rashes Others previously listed  Objective: Telemetry: Atrial fibrillation with bundle branch block Physical Exam: Blood pressure 121/74, pulse (!) 107, temperature 98 F (36.7 C), temperature source Oral, resp. rate 17, height 5' (1.524 m), weight 82.2 kg, SpO2 96 %. Body mass index is 35.39 kg/m. General: Well developed, well nourished, in no acute distress. Head: Normocephalic, atraumatic, sclera non-icteric, no xanthomas, nares are without discharge. Neck: No apparent masses Lungs: Normal respirations with few wheezes, no rhonchi, no rales , no crackles   Heart: Irregular rate and rhythm, normal S1 SOFT S2, 3+ aortic murmur, no rub, no gallop, PMI is normal size and placement, carotid upstroke normal without bruit, jugular venous pressure normal Abdomen: Soft, non-tender, non-distended with normoactive bowel  sounds. No hepatosplenomegaly. Abdominal aorta is normal size without bruit Extremities: Trace edema, no clubbing, no cyanosis, no ulcers,  Peripheral: 2+ radial, 2+ femoral, 1+ dorsal pedal pulses Neuro: Alert and oriented. Moves all extremities spontaneously. Psych:  Responds to questions appropriately with a normal affect.   Intake/Output Summary (Last 24 hours) at 04/15/2019 1239 Last data filed at 04/15/2019 1212 Gross per 24 hour  Intake --  Output 1600 ml  Net -1600 ml    Inpatient Medications:  . atorvastatin  40 mg Oral Daily  . carvedilol  25 mg Oral BID WC  . Chlorhexidine Gluconate Cloth  6 each Topical Daily  . cloNIDine  0.1 mg Transdermal Weekly  . furosemide  60 mg Intravenous Q8H  . mirtazapine  15 mg Oral QHS  . potassium chloride  20 mEq Oral BID  . sodium chloride flush  3 mL Intravenous Q12H  . sodium chloride flush  3 mL Intravenous Q12H   Infusions:  . sodium chloride    . sodium chloride 1 mL/kg/hr (04/15/19 1231)  . heparin Stopped (04/15/19 1000)    Labs: Recent Labs    04/12/19 2142 04/13/19 0541 04/14/19 0504 04/15/19 0503  NA  --    < > 141 141  K  --    < > 3.3* 3.5  CL  --    < > 101 98  CO2  --    < > 26 33*  GLUCOSE  --    < > 104* 116*  BUN  --    < > 18 19  CREATININE  --    < > 0.86 0.97  CALCIUM  --    < > 8.8* 9.1  MG 1.8  --   --   --    < > =  values in this interval not displayed.   Recent Labs    04/12/19 1727  AST 26  ALT 14  ALKPHOS 62  BILITOT 1.6*  PROT 7.3  ALBUMIN 3.6   Recent Labs    04/14/19 0504 04/15/19 0503  WBC 8.5 8.8  NEUTROABS 6.1 6.3  HGB 11.7* 11.7*  HCT 35.4* 35.0*  MCV 95.7 96.4  PLT 208 212   No results for input(s): CKTOTAL, CKMB, TROPONINI in the last 72 hours. Invalid input(s): POCBNP No results for input(s): HGBA1C in the last 72 hours.   Weights: Filed Weights   04/14/19 0500 04/15/19 0450 04/15/19 0814  Weight: 80 kg 82.2 kg 82.2 kg     Radiology/Studies:  US Venous Img  Lower Bilateral (DVT)  Result Date: 04/13/2019 CLINICAL DATA:  Lower extremity edema for years. Patient is on Eliquis. EXAM: BILATERAL LOWER EXTREMITY VENOUS DOPPLER ULTRASOUND TECHNIQUE: Gray-scale sonography with compression, as well as color and duplex ultrasound, were performed to evaluate the deep venous system(s) from the level of the common femoral vein through the popliteal and proximal calf veins. COMPARISON:  None. FINDINGS: VENOUS Normal compressibility of the common femoral, superficial femoral, and popliteal veins, as well as the visualized calf veins. Visualized portions of profunda femoral vein and great saphenous vein unremarkable. No filling defects to suggest DVT on grayscale or color Doppler imaging. Doppler waveforms show normal direction of venous flow, normal respiratory phasicity and response to augmentation. Limited views of the contralateral common femoral vein are unremarkable. OTHER None. Limitations: none IMPRESSION: No femoropopliteal DVT nor evidence of DVT within the visualized calf veins. If clinical symptoms are inconsistent or if there are persistent or worsening symptoms, further imaging (possibly involving the iliac veins) may be warranted. Electronically Signed   By: Amie Portland M.D.   On: 04/13/2019 16:01   DG Chest Port 1 View  Result Date: 04/13/2019 CLINICAL DATA:  SOB per physician order. Per physician notes - Patient presented with complaints of cough and shortness of breath. Found to have non-STEMI and acute hypoxic respiratory failure.Currently further plan is continue aggressive diuresis. Pt admitted yesterday for acute non-STEMI, acute on chronic possibly diastolic CHF, acute hypoxic respiratory failure. Hx - A-fib, stroke, CABG 1980, former smoker. EXAM: PORTABLE CHEST 1 VIEW COMPARISON:  04/12/2019 FINDINGS: Stable cardiomegaly. Moderate, left greater than right, pleural effusions. There are linear opacities in the left mid lung most likely atelectasis. No new  lung abnormalities.  No pneumothorax. IMPRESSION: 1. Cardiomegaly with moderate bilateral pleural effusions, left greater than right, associated with atelectasis. Underlying pneumonia is not excluded. No convincing pulmonary edema. Electronically Signed   By: Amie Portland M.D.   On: 04/13/2019 16:00   DG Chest Portable 1 View  Result Date: 04/12/2019 CLINICAL DATA:  Shortness of breath EXAM: PORTABLE CHEST 1 VIEW COMPARISON:  None. FINDINGS: Moderate bilateral pleural effusions with adjacent atelectasis. Probable mild pulmonary edema. No pneumothorax. Mild cardiomegaly. IMPRESSION: Moderate bilateral pleural effusions with adjacent atelectasis. Probable mild pulmonary edema. Mild cardiomegaly. Electronically Signed   By: Guadlupe Spanish M.D.   On: 04/12/2019 16:52   ECHOCARDIOGRAM COMPLETE  Result Date: 04/14/2019    ECHOCARDIOGRAM REPORT   Patient Name:   STEFANIE HODGENS Date of Exam: 04/13/2019 Medical Rec #:  [id number]   Height:       60.0 in Accession #:    [id number]  Weight:       178.6 lb Date of Birth:  02-13-1940   BSA:  1.779 m Patient Age:    9 years    BP:           137/75 mmHg Patient Gender: F           HR:           84 bpm. Exam Location:  [facility] Procedure: 2D Echo Indications:     Acute Diastolic CHF  History:         Patient has prior history of Echocardiogram examinations. Prior                  CABG, Stroke; Arrythmias:Atrial Fibrillation.  Sonographer:     LTM Referring Phys:  [id number] [place] Diagnosing Phys: Serafina Royals MD IMPRESSIONS  1. Left ventricular ejection fraction, by estimation, is 60 to 65%. The left ventricle has normal function. The left ventricle has no regional wall motion abnormalities. There is moderate left ventricular hypertrophy. Left ventricular diastolic parameters are consistent with Grade I diastolic dysfunction (impaired relaxation).  2. Right ventricular systolic function is normal. The right ventricular size is normal. There is moderately elevated  pulmonary artery systolic pressure.  3. Left atrial size was moderately dilated.  4. Right atrial size was moderately dilated.  5. The mitral valve is normal in structure. Moderate mitral valve regurgitation.  6. Tricuspid valve regurgitation is moderate.  7. The aortic valve is grossly normal. Aortic valve regurgitation is mild. Severe aortic valve stenosis. FINDINGS  Left Ventricle: Left ventricular ejection fraction, by estimation, is 60 to 65%. The left ventricle has normal function. The left ventricle has no regional wall motion abnormalities. The left ventricular internal cavity size was normal in size. There is  moderate left ventricular hypertrophy. Left ventricular diastolic parameters are consistent with Grade I diastolic dysfunction (impaired relaxation). Right Ventricle: The right ventricular size is normal. No increase in right ventricular wall thickness. Right ventricular systolic function is normal. There is moderately elevated pulmonary artery systolic pressure. The tricuspid regurgitant velocity is 2.81 m/s, and with an assumed right atrial pressure of 10 mmHg, the estimated right ventricular systolic pressure is 53.6 mmHg. Left Atrium: Left atrial size was moderately dilated. Right Atrium: Right atrial size was moderately dilated. Pericardium: There is no evidence of pericardial effusion. Mitral Valve: The mitral valve is normal in structure. Moderate mitral valve regurgitation. MV peak gradient, 13.5 mmHg. The mean mitral valve gradient is 5.0 mmHg. Tricuspid Valve: The tricuspid valve is normal in structure. Tricuspid valve regurgitation is moderate. Aortic Valve: The aortic valve is grossly normal. Aortic valve regurgitation is mild. Aortic regurgitation PHT measures 477 msec. Severe aortic stenosis is present. Aortic valve mean gradient measures 53.5 mmHg. Aortic valve peak gradient measures 100.8 mmHg. Aortic valve area, by VTI measures 0.53 cm. Pulmonic Valve: The pulmonic valve was normal  in structure. Pulmonic valve regurgitation is not visualized. Aorta: The aortic root and ascending aorta are structurally normal, with no evidence of dilitation. IAS/Shunts: No atrial level shunt detected by color flow Doppler.  LEFT VENTRICLE PLAX 2D LVIDd:         4.20 cm  Diastology LVIDs:         2.56 cm  LV e' lateral:   4.79 cm/s LV PW:         1.30 cm  LV E/e' lateral: 31.9 LV IVS:        1.42 cm  LV e' medial:    3.37 cm/s LVOT diam:     2.10 cm  LV E/e' medial:  45.4  LV SV:         53 LV SV Index:   30 LVOT Area:     3.46 cm  LEFT ATRIUM              Index       RIGHT ATRIUM           Index LA Vol (A2C):   115.0 ml 64.65 ml/m RA Area:     35.80 cm LA Vol (A4C):   96.8 ml  54.42 ml/m RA Volume:   138.00 ml 77.58 ml/m LA Biplane Vol: 112.0 ml 62.96 ml/m  AORTIC VALVE AV Area (Vmax):    0.56 cm AV Area (Vmean):   0.61 cm AV Area (VTI):     0.53 cm AV Vmax:           502.00 cm/s AV Vmean:          337.500 cm/s AV VTI:            1.000 m AV Peak Grad:      100.8 mmHg AV Mean Grad:      53.5 mmHg LVOT Vmax:         80.50 cm/s LVOT Vmean:        59.000 cm/s LVOT VTI:          0.152 m LVOT/AV VTI ratio: 0.15 AI PHT:            477 msec  AORTA Ao Root diam: 3.60 cm MITRAL VALVE                TRICUSPID VALVE MV Area (PHT): 2.87 cm     TR Peak grad:   31.6 mmHg MV Peak grad:  13.5 mmHg    TR Vmax:        281.00 cm/s MV Mean grad:  5.0 mmHg MV Vmax:       1.84 m/s     SHUNTS MV Vmean:      108.0 cm/s   Systemic VTI:  0.15 m MV Decel Time: 264 msec     Systemic Diam: 2.10 cm MV E velocity: 153.00 cm/s Arnoldo Hooker MD Electronically signed by Arnoldo Hooker MD Signature Date/Time: 04/14/2019/7:22:57 AM    Final      Assessment and Recommendation  79 y.o. female with known coronary artery disease status post coronary bypass graft hypertension hyperlipidemia atrial fibrillation bundle branch block peripheral vascular disease hypertension with a non-ST elevation myocardial infarction and diastolic  dysfunction congestive heart failure now slightly improved With nstemi likley due to diastolic dysfx chf and aortic stenosis rather than acs 1.  Continue heparin for further risk reduction in stroke with atrial fibrillation and myocardial infarction and change back to elequis when able 2.  Carvedilol for heart rate control and congestive heart failure 3.  Furosemide for congestive heart failure multifactorial in nature 4. Further consider ation of aortic valve replacement 5. If amulationg tomorrow will consider dc to home with fu discussion of valve repalcement  Signed, Arnoldo Hooker M.D. FACC

## 2019-04-15 NOTE — Consult Note (Signed)
ANTICOAGULATION CONSULT NOTE   Pharmacy Consult for Heparin Infusion Indication: chest pain/ACS  Allergies  Allergen Reactions  . Aspirin Palpitations    Per Pt: Makes heart flutter.    Patient Measurements: Height: 5' (152.4 cm) Weight: 181 lb 3.2 oz (82.2 kg) IBW/kg (Calculated) : 45.5 Heparin Dosing Weight: 63.8 kg  Vital Signs: Temp: 98 F (36.7 C) (03/15 0450) Temp Source: Oral (03/15 0450) BP: 129/94 (03/15 0450) Pulse Rate: 107 (03/15 0450)  Labs: Recent Labs    04/12/19 1727 04/12/19 2142 04/12/19 2142 04/13/19 0005 04/13/19 0541 04/13/19 0541 04/13/19 0723 04/13/19 1427 04/13/19 1623 04/13/19 1629 04/14/19 0022 04/14/19 0504 04/15/19 0503  HGB   < >  --   --   --  12.3   < >  --   --   --   --   --  11.7* 11.7*  HCT   < >  --   --   --  37.3  --   --   --   --   --   --  35.4* 35.0*  PLT   < >  --   --   --  188  --   --   --   --   --   --  208 212  APTT  --  33   < >  --   --   --  132*  --   --    < > 82* 80* 88*  LABPROT  --  15.3*  --   --   --   --   --   --   --   --   --   --   --   INR  --  1.2  --   --   --   --   --   --   --   --   --   --   --   HEPARINUNFRC  --  >3.60*  --   --   --   --  >3.60*  --   --   --   --   --  1.46*  CREATININE   < >  --   --   --  0.95  --   --   --   --   --   --  0.86 0.97  TROPONINIHS  --  3,585*   < > 5,085*  --   --   --  4,356* 3,641*  --   --   --   --    < > = values in this interval not displayed.    Estimated Creatinine Clearance: 44.7 mL/min (by C-G formula based on SCr of 0.97 mg/dL).   Medical History: Past Medical History:  Diagnosis Date  . A-fib (HCC)   . Stroke [facility])     Medications:  Apixaban 2.5 mg BID prior to admission (last dose 3/12 @ 1200)  Assessment: Patient is a 79 y/o F with a past medical history including Afib and stroke who presented with shortness of breath. Troponins elevated to 560. Pharmacy has been consulted to initiate heparin for ACS.   Baseline CBC significant  for mild anemia (Hgb 11.4), Plt WNL. Baseline coags pending.   -Heparin 1500 unit bolus IV x 1 followed by 750 units/hr  3/13 @ 0723 aPTT 132. decreaseD Heparin to 650 units/hr  3/14 @ 0500 aPTT 80 sec  Goal of Therapy:  Heparin Level: 0.3-0.7 aPTT 66-102 seconds Monitor platelets by anticoagulation  protocol: Yes   Plan:  3/15 @ 0503 aPTT 88 sec, HL: 1.46. Marland Kitchen APTT therapeutic. APTT and HL not yet correlating.  Will continue current rate of Heparin infusion of 650 units/hr and recheck aPTT and HL w/ am labs.  CBC daily per protocol.   Pernell Dupre, PharmD Clinical Pharmacist 04/15/2019 7:25 AM

## 2019-04-15 NOTE — Progress Notes (Signed)
PT Cancellation Note  Patient Details Name: Janeya Deyo MRN: [id number] DOB: 04-Feb-1940   Cancelled Treatment:    Reason Eval/Treat Not Completed: Patient not medically ready.  Chart reviewed.  Pt currently on bedrest s/p procedure this morning.  Will hold PT at this time and re-attempt PT evaluation at a later date/time as medically appropriate.  Hendricks Limes, PT 04/15/19, 1:22 PM

## 2019-04-15 NOTE — Progress Notes (Signed)
[facility] Progress Note  Patient: Valerie Trujillo    NID:[id number]  DOA: 04/12/2019     Date of Service: the patient was seen and examined on 04/15/2019  Chief Complaint  Patient presents with  . Shortness of Breath   Brief hospital course: CAD SP CABG, HTN, HLD, PVD, CVA, chronic A. fib, severe aortic stenosis.  Patient presented with complaints of cough and shortness of breath.  Found to have non-STEMI and acute hypoxic respiratory failure. Currently further plan is continue further cardiac work-up and diuresis  Assessment and Plan: 1.  Acute non-STEMI. 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 Acute hypoxic respiratory failure. Aortic stenosis/severe. History of CAD SP CABG protection essential hypertension. Dyslipidemia. PVD Cardiology consulted. EKG shows evidence of LBBB. Cardiology currently not planning any intervention today. Currently on IV heparin. Reducing clonidine dose to allow for aggressive diuresis. IV Lasix dose increased from 40 mg twice daily to 60 mg 3 times daily.   D-dimer elevated currently on therapeutic anticoagulation.  Negative Doppler for DVT Continue Lipitor. Continue Coreg. Echocardiogram shows preserved EF. Underwent left heart catheterization which showed patent vessel with severe aortic stenosis. Cardiology recommending outpatient follow-up for further discussion of aortic valve replacement procedures.  To allow for aggressive diuresis blood pressure medication adjusted. Currently Norvasc is on hold, clonidine reduced from 0.2 mg twice daily 2.1 mg twice daily. Currently on lisinopril 40 mg daily. Metoprolol 100 mg daily transition to Coreg 25 mg twice daily.  2.  Anxiety As needed Xanax.  3.   hematuria Overactive bladder Initially had some mild pink-tinged urine. Holding Myrbetriq for now. Outpatient follow-up with urology recommended  4.  Hypokalemia Corrected  5.  Acute kidney injury Likely cardiorenal hemodynamics.  Improving with diuretics. Monitor closely.  6.  Chronic anticoagulation On Eliquis. Currently on IV heparin.  Monitor.  Body mass index is 35.39 kg/m.    Interventions:        Diet: Cardiac diet, n.p.o. after midnight for cardiac catheterization DVT Prophylaxis: Therapeutic Anticoagulation with Heparin, Eliquis on hold   Advance goals of care discussion: Full code  Family Communication: Niece family was present at bedside, at the time of interview.  Patient provided permission.  All questions answered satisfactorily.  Disposition:  Pt is from home, admitted with non-STEMI, still has hypoxia and is post-cath via groin access, which precludes a safe discharge. Discharge to home, when medically stable.  Subjective: Continues to report shortness of breath continues to report fatigue and tiredness.  No nausea no vomiting but still remains hypoxic.  Physical Exam: General:  alert oriented to time, place, and person.  Appear in moderate distress, affect appropriate Eyes: PERRL ENT: Oral Mucosa Clear, moist  Neck: no JVD,  Cardiovascular: S1 and S2 Present, no Murmur,  Respiratory: good respiratory effort, Bilateral Air entry equal and Decreased, bilateral  Crackles, no wheezes Abdomen: Bowel Sound present, Soft and no tenderness,  Skin: no rash Extremities: bilateral  Pedal edema, no calf tenderness Neurologic: without any new focal findings Gait not checked due to patient safety concerns  Vitals:   04/15/19 1242 04/15/19 1617 04/15/19 1726 04/15/19 1817  BP: 105/80 (!) 85/63 93/60 101/64  Pulse: 98 95 95 88  Resp: 16 16    Temp: 98.2 F (36.8 C) 97.7 F (36.5 C)    TempSrc: Oral Oral    SpO2: 100% 100%    Weight:      Height:        Intake/Output Summary (Last 24 hours) at 04/15/2019 1931 Last  data filed at 04/15/2019 1600 Gross per 24 hour  Intake 169.74 ml  Output 1375 ml  Net -1205.26 ml   Filed Weights   04/14/19 0500 04/15/19 0450 04/15/19 0814  Weight:  80 kg 82.2 kg 82.2 kg    Data Reviewed: I have personally reviewed and interpreted daily labs, tele strips, imagings as discussed above. I reviewed all nursing notes, pharmacy notes, vitals, pertinent old records I have discussed plan of care as described above with RN and patient/family.  CBC: Recent Labs  Lab 04/12/19 1727 04/13/19 0541 04/14/19 0504 04/15/19 0503  WBC 8.8 9.7 8.5 8.8  NEUTROABS 7.6 7.4 6.1 6.3  HGB 11.4* 12.3 11.7* 11.7*  HCT 34.5* 37.3 35.4* 35.0*  MCV 95.6 95.6 95.7 96.4  PLT 201 188 208 742   Basic Metabolic Panel: Recent Labs  Lab 04/12/19 1727 04/12/19 2142 04/13/19 0541 04/14/19 0504 04/15/19 0503  NA 139  --  142 141 141  K 3.2*  --  3.6 3.3* 3.5  CL 106  --  106 101 98  CO2 22  --  26 26 33*  GLUCOSE 141*  --  98 104* 116*  BUN 22  --  22 18 19   CREATININE 1.20*  --  0.95 0.86 0.97  CALCIUM 8.8*  --  8.8* 8.8* 9.1  MG  --  1.8  --   --   --     Studies: CARDIAC CATHETERIZATION  Result Date: 04/15/2019  Ost RCA to Prox RCA lesion is 55% stenosed.  Prox RCA lesion is 80% stenosed.  Mid RCA lesion is 100% stenosed.  Dist RCA lesion is 100% stenosed.  Prox Cx lesion is 100% stenosed.  Mid LAD lesion is 100% stenosed.  Prox LAD lesion is 80% stenosed.  Origin lesion is 100% stenosed.  79 year old female with known coronary artery disease status post coronary to bypass graft in the remote past with chronic nonvalvular atrial fibrillation and previous stroke hyperlipidemia hyperlipidemia peripheral vascular disease hypertension and bundle branch block who has had an acute onset of non-ST elevation myocardial infarction with a troponin of 5085 and chronic diastolic and acute diastolic dysfunction heart failure. Cardiac catheterization showing normal LV systolic function with increased left ventricular end-diastolic pressures consistent with diastolic dysfunction with ejection fraction of 65% PA pressures of 49/24 with a mean of 35 mm consistent  with mild pulmonary hypertension Pulmonary capillary wedge pressure mean of 19 Aortic pressure of 110/63 with left ventricular pressures of 161/18 having a peak gradient of 50 mm Aortic valve area 0.8 cm Echocardiogram showing normal LV systolic function with a peak gradient of 77 mm mean gradient of 42 mm and an aortic velocity of 4.4 m/s LIMA to left anterior descending artery patent SVG to obtuse marginal 1 patent SVG to PDA occluded with collateral blood flow from distal LAD to PDA Mid right coronary artery, proximal left circumflex artery, mid left anterior descending artery occluded Assessment 79 year old female with acute on chronic diastolic dysfunction congestive heart failure with non-ST ovation myocardial infarction and no apparent acute coronary syndrome with culprit arterial or graft changes from previous cardiac catheterization consistent with demand ischemia and severe aortic valve stenosis Plan No further cardiac intervention at this time due to no evidence of acute coronary syndrome Continue medication management for coronary artery disease with aspirin 81 mg Diuretics beta-blocker ACE inhibitor for diastolic dysfunction congestive heart failure Anticoagulation for further risk reduction of stroke with atrial fibrillation Ambulation and follow for improvements of symptoms Cardiac rehabilitation  Further consideration discussion with cardiovascular surgery TAVR team to assess appropriateness for TAVR    Scheduled Meds: . atorvastatin  40 mg Oral Daily  . carvedilol  25 mg Oral BID WC  . Chlorhexidine Gluconate Cloth  6 each Topical Daily  . cloNIDine  0.1 mg Transdermal Weekly  . furosemide  40 mg Intravenous BID  . mirtazapine  15 mg Oral QHS  . potassium chloride  20 mEq Oral BID  . sodium chloride flush  3 mL Intravenous Q12H  . sodium chloride flush  3 mL Intravenous Q12H   Continuous Infusions: . sodium chloride    . sodium chloride 1 mL/kg/hr (04/15/19 1231)  . heparin Stopped  (04/15/19 6433)   PRN Meds: sodium chloride, acetaminophen, acetaminophen, ALPRAZolam, ipratropium-albuterol, Muscle Rub, nitroGLYCERIN, ondansetron (ZOFRAN) IV, ondansetron (ZOFRAN) IV, senna-docusate, sodium chloride flush, zolpidem  Time spent: 35 minutes  Author: Lynden Oxford, MD [facility] 04/15/2019 7:31 PM  To reach On-call, see care teams to locate the attending and reach out to them via [email or web address]. If 7PM-7AM, please contact night-coverage If you still have difficulty reaching the attending provider, please page the [facility] (Director on Call) for [facility] on amion for assistance.

## 2019-04-15 NOTE — Progress Notes (Signed)
PT Cancellation Note  Patient Details Name: Valerie Trujillo MRN: [id number] DOB: [birth year]   Cancelled Treatment:    Reason Eval/Treat Not Completed: Patient at procedure or test/unavailable.  PT consult received.  Chart reviewed.  Pt currently off floor for procedure.  Per chart review, plan for cardiac cath today.  Will re-attempt PT evaluation at a later date/time as medically appropriate.  Hendricks Limes, PT 04/15/19, 8:51 AM

## 2019-04-15 NOTE — Care Management Important Message (Signed)
Important Message  Patient Details  Name: Valerie Trujillo MRN: [id number] Date of Birth: [birth year]   Medicare Important Message Given:  Yes     Johnell Comings 04/15/2019, 12:31 PM

## 2019-04-15 NOTE — Progress Notes (Signed)
Pt returned from cath lab. Right femoral gauze dressing intact. No bleeding noted. Level 0. Pt denies any pain and or sob. Vss. Pt aware not to sit up or move leg until 1330.understanding verbalized,  Pt niece at bedside

## 2019-04-16 ENCOUNTER — Inpatient Hospital Stay: Payer: Medicare Other

## 2019-04-16 ENCOUNTER — Encounter: Payer: Self-pay | Admitting: Cardiology

## 2019-04-16 LAB — CBC WITH DIFFERENTIAL/PLATELET
Abs Immature Granulocytes: 0.03 10*3/uL (ref 0.00–0.07)
Basophils Absolute: 0 10*3/uL (ref 0.0–0.1)
Basophils Relative: 1 %
Eosinophils Absolute: 0.1 10*3/uL (ref 0.0–0.5)
Eosinophils Relative: 1 %
HCT: 34.4 % — ABNORMAL LOW (ref 36.0–46.0)
Hemoglobin: 11.7 g/dL — ABNORMAL LOW (ref 12.0–15.0)
Immature Granulocytes: 0 %
Lymphocytes Relative: 11 %
Lymphs Abs: 0.9 10*3/uL (ref 0.7–4.0)
MCH: 32.5 pg (ref 26.0–34.0)
MCHC: 34 g/dL (ref 30.0–36.0)
MCV: 95.6 fL (ref 80.0–100.0)
Monocytes Absolute: 1.2 10*3/uL — ABNORMAL HIGH (ref 0.1–1.0)
Monocytes Relative: 14 %
Neutro Abs: 6.5 10*3/uL (ref 1.7–7.7)
Neutrophils Relative %: 73 %
Platelets: 212 10*3/uL (ref 150–400)
RBC: 3.6 MIL/uL — ABNORMAL LOW (ref 3.87–5.11)
RDW: 14.3 % (ref 11.5–15.5)
WBC: 8.8 10*3/uL (ref 4.0–10.5)
nRBC: 0 % (ref 0.0–0.2)

## 2019-04-16 LAB — CBC
HCT: 36.3 % (ref 36.0–46.0)
Hemoglobin: 11.8 g/dL — ABNORMAL LOW (ref 12.0–15.0)
MCH: 32.1 pg (ref 26.0–34.0)
MCHC: 32.5 g/dL (ref 30.0–36.0)
MCV: 98.6 fL (ref 80.0–100.0)
Platelets: 202 10*3/uL (ref 150–400)
RBC: 3.68 MIL/uL — ABNORMAL LOW (ref 3.87–5.11)
RDW: 14.4 % (ref 11.5–15.5)
WBC: 8.8 10*3/uL (ref 4.0–10.5)
nRBC: 0 % (ref 0.0–0.2)

## 2019-04-16 LAB — BASIC METABOLIC PANEL
Anion gap: 14 (ref 5–15)
BUN: 20 mg/dL (ref 8–23)
CO2: 30 mmol/L (ref 22–32)
Calcium: 8.9 mg/dL (ref 8.9–10.3)
Chloride: 99 mmol/L (ref 98–111)
Creatinine, Ser: 1.17 mg/dL — ABNORMAL HIGH (ref 0.44–1.00)
GFR calc Af Amer: 51 mL/min — ABNORMAL LOW (ref >60)
GFR calc non Af Amer: 44 mL/min — ABNORMAL LOW (ref >60)
Glucose, Bld: 126 mg/dL — ABNORMAL HIGH (ref 70–99)
Potassium: 3.7 mmol/L (ref 3.5–5.1)
Sodium: 143 mmol/L (ref 135–145)

## 2019-04-16 LAB — RESPIRATORY PANEL BY RT PCR (FLU A&B, COVID)
Influenza A by PCR: NEGATIVE
Influenza B by PCR: NEGATIVE
SARS Coronavirus 2 by RT PCR: NEGATIVE

## 2019-04-16 LAB — APTT: aPTT: 37 seconds — ABNORMAL HIGH (ref 24–36)

## 2019-04-16 LAB — HEPARIN LEVEL (UNFRACTIONATED): Heparin Unfractionated: 0.69 IU/mL (ref 0.30–0.70)

## 2019-04-16 MED ORDER — FUROSEMIDE 40 MG PO TABS: 40.0000 mg | ORAL_TABLET | Freq: Two times a day (BID) | ORAL | 0 refills | Status: AC

## 2019-04-16 MED ORDER — OXYCODONE HCL 5 MG PO TABS
5.0000 mg | ORAL_TABLET | ORAL | Status: DC | PRN
  Administered 2019-04-16: 5 mg via ORAL
  Filled 2019-04-16: qty 1

## 2019-04-16 MED ORDER — POTASSIUM CHLORIDE CRYS ER 20 MEQ PO TBCR: 20.0000 meq | EXTENDED_RELEASE_TABLET | Freq: Every day | ORAL | 0 refills | Status: AC

## 2019-04-16 MED ORDER — CLONIDINE 0.1 MG/24HR TD PTWK: 0.1000 mg | MEDICATED_PATCH | TRANSDERMAL | 12 refills | Status: DC

## 2019-04-16 MED ORDER — ATORVASTATIN CALCIUM 40 MG PO TABS: 40.0000 mg | ORAL_TABLET | Freq: Every day | ORAL | 0 refills | Status: AC

## 2019-04-16 MED ORDER — APIXABAN 2.5 MG PO TABS
2.5000 mg | ORAL_TABLET | Freq: Two times a day (BID) | ORAL | Status: DC
  Administered 2019-04-16 – 2019-04-17 (×3): 2.5 mg via ORAL
  Filled 2019-04-16 (×3): qty 1

## 2019-04-16 MED ORDER — POLYETHYLENE GLYCOL 3350 17 G PO PACK
17.0000 g | PACK | Freq: Every day | ORAL | Status: DC
  Filled 2019-04-16: qty 1

## 2019-04-16 MED ORDER — CARVEDILOL 25 MG PO TABS: 25.0000 mg | ORAL_TABLET | Freq: Two times a day (BID) | ORAL | 0 refills | Status: AC

## 2019-04-16 MED ORDER — FUROSEMIDE 40 MG PO TABS
40.0000 mg | ORAL_TABLET | Freq: Two times a day (BID) | ORAL | Status: DC
  Administered 2019-04-16 – 2019-04-17 (×2): 40 mg via ORAL
  Filled 2019-04-16 (×2): qty 1

## 2019-04-16 MED ORDER — POLYETHYLENE GLYCOL 3350 17 G PO PACK: 17.0000 g | PACK | Freq: Every day | ORAL | 0 refills | Status: AC

## 2019-04-16 NOTE — Plan of Care (Signed)

## 2019-04-16 NOTE — TOC Progression Note (Signed)
Transition of Care [facility]) - Progression Note    Patient Details  Name: Valerie Trujillo MRN: [id number] Date of Birth: Jun 08, 1940  Transition of Care [facility]) CM/SW Contact  Maree Krabbe, LCSW Phone Number: 04/16/2019, 1:46 PM  Clinical Narrative:   PT has now changed their recommendation to SNF. Pt's niece at bedside. Pt was asleep and did not wake to CSW voice. Pt's Niece is agreeable to pt d/c to SNF as Niece can not care for pt at home with the amount of assistance she is requiring. Pt's Niece is agreeable to [facility]. CSW will send referral.    Expected Discharge Plan: Home w Home Health Services Barriers to Discharge: Continued Medical Work up  Expected Discharge Plan and Services Expected Discharge Plan: Home w Home Health Services In-house Referral: Clinical Social Work   Post Acute Care Choice: Home Health Living arrangements for the past 2 months: Single Family Home Expected Discharge Date: 04/16/19                         HH Arranged: PT HH Agency: [facility] Date HH Agency Contacted: 04/16/19 Time HH Agency Contacted: 1010 Representative spoke with at [facility] Agency: Barbara Cower   Social Determinants of Health (SDOH) Interventions    Readmission Risk Interventions No flowsheet data found.

## 2019-04-16 NOTE — NC FL2 (Signed)
[place] LEVEL OF CARE SCREENING TOOL     IDENTIFICATION  Patient Name: Valerie Trujillo Birthdate: 12-18-1940 Sex: female Admission Date (Current Location): 04/12/2019  [place] and [place] Number:  Engineering geologist and Address:  [facility], [address], [place], [place] [address]      Provider Number: [id number]  Attending Physician Name and Address:  Lavina Hamman, MD  Relative Name and Phone Number:       Current Level of Care: Hospital Recommended Level of Care: [place] Prior Approval Number:    Date Approved/Denied:   PASRR Number: [id number]  Discharge Plan: SNF    Current Diagnoses: Patient Active Problem List   Diagnosis Date Noted  . Acute respiratory failure ([place]) 04/12/2019  . Hypertensive emergency 01/14/2018  . Uncontrolled hypertension 01/14/2018    Orientation RESPIRATION BLADDER Height & Weight     Self, Time, Situation, Place  O2([place] 3L) Continent Weight: 177 lb 3.2 oz (80.4 kg) Height:  5' (152.4 cm)  BEHAVIORAL SYMPTOMS/MOOD NEUROLOGICAL BOWEL NUTRITION STATUS      Incontinent Diet(heart healthy)  AMBULATORY STATUS COMMUNICATION OF NEEDS Skin   Extensive Assist Verbally Normal                       Personal Care Assistance Level of Assistance  Dressing, Feeding, Bathing Bathing Assistance: Maximum assistance Feeding assistance: Independent Dressing Assistance: Maximum assistance     Functional Limitations Info  Sight, Speech, Hearing Sight Info: Adequate Hearing Info: Adequate Speech Info: Adequate    SPECIAL CARE FACTORS FREQUENCY  PT (By licensed PT), OT (By licensed OT)     PT Frequency: 5x OT Frequency: 5x            Contractures Contractures Info: Not present    Additional Factors Info  Code Status, Allergies Code Status Info: Full Code Allergies Info: Aspirin           Current Medications (04/16/2019):  This is the current hospital active  medication list Current Facility-Administered Medications  Medication Dose Route Frequency Provider Last Rate Last Admin  . 0.9 %  sodium chloride infusion  250 mL Intravenous PRN Mansy, Jan A, MD      . acetaminophen (TYLENOL) tablet 650 mg  650 mg Oral Q4H PRN Corey Skains, MD   650 mg at 04/16/19 0057  . ALPRAZolam Duanne Moron) tablet 0.25 mg  0.25 mg Oral BID PRN Mansy, Jan A, MD   0.25 mg at 04/14/19 0807  . apixaban (ELIQUIS) tablet 2.5 mg  2.5 mg Oral BID Lavina Hamman, MD   2.5 mg at 04/16/19 1000  . atorvastatin (LIPITOR) tablet 40 mg  40 mg Oral Daily Mansy, Jan A, MD   40 mg at 04/16/19 1000  . carvedilol (COREG) tablet 25 mg  25 mg Oral BID WC Mansy, Jan A, MD   25 mg at 04/16/19 1000  . cloNIDine (CATAPRES - Dosed in mg/24 hr) patch 0.1 mg  0.1 mg Transdermal Weekly Lavina Hamman, MD   0.1 mg at 04/13/19 1119  . furosemide (LASIX) tablet 40 mg  40 mg Oral BID Lavina Hamman, MD      . ipratropium-albuterol (DUONEB) 0.5-2.5 (3) MG/3ML nebulizer solution 3 mL  3 mL Nebulization Q4H PRN Lavina Hamman, MD      . mirtazapine (REMERON) tablet 15 mg  15 mg Oral QHS Mansy, Jan A, MD   15 mg at 04/15/19 2141  .  Muscle Rub CREA   Topical PRN Rolly Salter, MD   Given at 04/14/19 2003  . nitroGLYCERIN (NITROSTAT) SL tablet 0.4 mg  0.4 mg Sublingual Q5 min PRN Mansy, Jan A, MD      . ondansetron [facility]) injection 4 mg  4 mg Intravenous Q6H PRN Lamar Blinks, MD      . oxyCODONE (Oxy IR/ROXICODONE) immediate release tablet 5 mg  5 mg Oral Q4H PRN Rolly Salter, MD   5 mg at 04/16/19 1213  . polyethylene glycol (MIRALAX / GLYCOLAX) packet 17 g  17 g Oral Daily Lynden Oxford M, MD      . potassium chloride SA (KLOR-CON) CR tablet 20 mEq  20 mEq Oral BID Rolly Salter, MD   20 mEq at 04/16/19 1001  . senna-docusate (Senokot-S) tablet 1 tablet  1 tablet Oral QHS PRN Mansy, Jan A, MD      . sodium chloride flush (NS) 0.9 % injection 3 mL  3 mL Intravenous Q12H Mansy, Jan A, MD   3 mL  at 04/16/19 1000  . sodium chloride flush (NS) 0.9 % injection 3 mL  3 mL Intravenous PRN Mansy, Jan A, MD      . sodium chloride flush (NS) 0.9 % injection 3 mL  3 mL Intravenous Q12H Lamar Blinks, MD   3 mL at 04/16/19 1000  . zolpidem (AMBIEN) tablet 5 mg  5 mg Oral QHS PRN Mansy, Vernetta Honey, MD         Discharge Medications: Please see discharge summary for a list of discharge medications.  Relevant Imaging Results:  Relevant Lab Results:   Additional Information SSN:[id number]  Reuel Boom Rannie Craney, LCSW

## 2019-04-16 NOTE — Progress Notes (Signed)
Patient was going to leave to go to facility, but was hypotensive and acting sluggish. Covering provider-[facility] notified as well as Attending Phys. Patel. Clonidine patch removed at this time per MD. Will continue to monitor.

## 2019-04-16 NOTE — TOC Transition Note (Signed)
Transition of Care [facility]) - CM/SW Discharge Note   Patient Details  Name: Valerie Trujillo MRN: [id number] Date of Birth: [birth year]  Transition of Care [facility]) CM/SW Contact:  Maree Krabbe, LCSW Phone Number: 04/16/2019, 3:38 PM   Clinical Narrative:   Clinical Social Worker facilitated patient discharge including contacting patient family and facility to confirm patient discharge plans.  Clinical information faxed to facility and family agreeable with plan.  CSW arranged ambulance transport via [facility] to [facility].  RN to call [phone] for report prior to discharge.  Final next level of care: Skilled Nursing Facility Barriers to Discharge: No Barriers Identified   Patient Goals and CMS Choice Patient states their goals for this hospitalization and ongoing recovery are:: to get better   Choice offered to / list presented to : Patient  Discharge Placement              Patient chooses bed at: [facility] [place] Patient to be transferred to facility by: [facility] Name of family member notified: Britta Mccreedy at bedside Patient and family notified of of transfer: 04/16/19  Discharge Plan and Services In-house Referral: Clinical Social Work   Post Acute Care Choice: Home Health                    HH Arranged: PT [facility] Agency: [facility] Date HH Agency Contacted: 04/16/19 Time HH Agency Contacted: 1010 Representative spoke with at [facility] Agency: Barbara Cower  Social Determinants of Health (SDOH) Interventions     Readmission Risk Interventions No flowsheet data found.

## 2019-04-16 NOTE — Progress Notes (Signed)
Report called to Geneticist, molecular at [facility]. Pt going to room 809.

## 2019-04-16 NOTE — Plan of Care (Signed)
  Problem: Education: Goal: Ability to demonstrate management of disease process will improve Outcome: Progressing  Patient provided literature and reviewed Zone recognition.

## 2019-04-16 NOTE — Evaluation (Signed)
Physical Therapy Evaluation Patient Details Name: Valerie Trujillo MRN: [id number] DOB: 08-23-1940 Today's Date: 04/16/2019   History of Present Illness  Valerie Trujillo is a 78yoF who comes [facility] on 3/12 c 4d progressive SOB. Pt admitted with suspected NSTEMI. Underwent cardiac cath on 04/15/19. 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  Clinical Impression  Pt admitted with above diagnosis. Pt currently with functional limitations due to the deficits listed below (see "PT Problem List"). Upon entry, pt in bed, awake and agreeable to participate. The pt is alert and oriented x3, pleasant, conversational, and generally a good historian. Pt on 4LO2 upon entry 92% SpO2, trialed on room air with desaturation to 86% at rest. Pt requires maxA to EOB due to severe weakness, noted to have loose stool, also poor rate control in AFrates 110s to 120s. Functional mobility assessment demonstrates increased effort/time requirements, poor tolerance, and need for physical assistance, whereas the patient performed these at a higher level of independence PTA. Evaluation is limited, but will return later in day for treatment to assess OOB, pending rate control. Pt will benefit from skilled PT intervention to increase independence and safety with basic mobility in preparation for discharge to the venue listed below.       Follow Up Recommendations Home health PT;Supervision for mobility/OOB    Equipment Recommendations  Other (comment)(unable to do OOB assessment yet; may require a WC/Transport chair if remains weak; will need O2 for home.)    Recommendations for Other Services       Precautions / Restrictions Precautions Precautions: Fall Restrictions Weight Bearing Restrictions: No      Mobility  Bed Mobility Overal bed mobility: Needs Assistance Bed Mobility: Supine to Sit;Sit to Supine     Supine to sit: Max assist Sit to supine: Max assist    General bed mobility comments: incredibly weak, unable to produce much independent movement; Rates in 110s-129bpm in AF, RN reports meds not yet given. Noted loose stool at EOB, pt returned to supine, RN made aware.  Transfers                    Ambulation/Gait                Stairs            Wheelchair Mobility    Modified Rankin (Stroke Patients Only)       Balance                                             Pertinent Vitals/Pain Pain Assessment: (ABD tightness/fullness)    Home Living Family/patient expects to be discharged to:: Private residence Living Arrangements: Other relatives(niece) Available Help at Discharge: Family Type of Home: Apartment Home Access: Stairs to enter     Home Layout: One level Home Equipment: Environmental consultant - 2 wheels      Prior Function Level of Independence: Independent         Comments: Niece assist with ADL; HH AMB s AD PTA; Ptr denies any falls or instability of gait PTA.     Hand Dominance        Extremity/Trunk Assessment   Upper Extremity Assessment Upper Extremity Assessment: Generalized weakness    Lower Extremity Assessment Lower Extremity Assessment: Generalized weakness       Communication   Communication: No difficulties  Cognition Arousal/Alertness: Awake/alert Behavior During Therapy: WFL for tasks assessed/performed Overall Cognitive Status: Within Functional Limits for tasks assessed                                        General Comments      Exercises     Assessment/Plan    PT Assessment Patient needs continued PT services  PT Problem List Decreased strength;Decreased activity tolerance;Decreased balance;Decreased mobility       PT Treatment Interventions DME instruction;Balance training;Gait training;Stair training;Functional mobility training;Therapeutic activities;Therapeutic exercise;Patient/family education    PT Goals (Current  goals can be found in the Care Plan section)  Acute Rehab PT Goals Patient Stated Goal: regain strength and independence in mobility PT Goal Formulation: With patient Time For Goal Achievement: 04/30/19 Potential to Achieve Goals: Fair    Frequency Min 2X/week   Barriers to discharge        Co-evaluation               AM-PAC PT "6 Clicks" Mobility  Outcome Measure Help needed turning from your back to your side while in a flat bed without using bedrails?: Total Help needed moving from lying on your back to sitting on the side of a flat bed without using bedrails?: Total Help needed moving to and from a bed to a chair (including a wheelchair)?: Total Help needed standing up from a chair using your arms (e.g., wheelchair or bedside chair)?: Total Help needed to walk in hospital room?: Total Help needed climbing 3-5 steps with a railing? : Total 6 Click Score: 6    End of Session Equipment Utilized During Treatment: Oxygen Activity Tolerance: Patient limited by fatigue;Patient limited by lethargy;Treatment limited secondary to medical complications (Comment)(AF, loose stool) Patient left: in bed;with bed alarm set;with call bell/phone within reach Nurse Communication: Mobility status PT Visit Diagnosis: Difficulty in walking, not elsewhere classified (R26.2);Muscle weakness (generalized) (M62.81);Other abnormalities of gait and mobility (R26.89)    Time: 5852-7782 PT Time Calculation (min) (ACUTE ONLY): 24 min   Charges:   PT Evaluation $PT Eval Moderate Complexity: 1 Mod          9:51 AM, 04/16/19 Etta Grandchild, PT, DPT Physical Therapist - [facility]  [phone] ([place])    Stephens Shreve C 04/16/2019, 9:47 AM

## 2019-04-16 NOTE — Discharge Summary (Signed)
[facility] Discharge Summary   Patient: Valerie Trujillo ZOX:[id number]  PCP: Woodroe Chen, MD  Date of admission: 04/12/2019   Date of discharge:  04/16/2019     Discharge Diagnoses:  Principal diagnosis NSTEMI   Active Problems:   Acute respiratory failure (HCC) Acute non-STEMI. 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 Acute hypoxic respiratory failure. Aortic stenosis/severe. History of CAD SP CABG protection essential hypertension. Dyslipidemia. PVD  Admitted From: home Disposition:  SNF   Recommendations for Outpatient Follow-up:  1. PCP: please follow up with PCP in 1 week and Cardiology in 2 weeks 2. Follow up LABS/TEST:  none  Follow-up Information    [facility] Follow up on 04/24/2019.   Specialty: Cardiology Why: at 12:00pm. Enter through the Medical Mall entrance Contact information: [address] [place] [address] [phone]       Woodroe Chen, MD. Go on 04/25/2019.   Specialty: Geriatric Medicine Why: Appointment at 9:45am Contact information: [address] [facility] [place] [address] [phone]        Lamar Blinks, MD. Schedule an appointment as soon as possible for a visit in 2 week(s).   Specialty: Cardiology Contact information: [address] [facility] [place] [address] [phone]          Diet recommendation: Cardiac diet  Activity: The patient is advised to gradually reintroduce usual activities, as tolerated  Discharge Condition: stable  Code Status: Full code   History of present illness: As per the H and P dictated on admission, "Valerie Trujillo  is a 79 y.o. female with a known history of coronary artery disease status post three-vessel CABG, CVA and atrial fibrillation, on Eliquis, who presented to the emergency room with acute onset of worsening dyspnea over the last 4 days specifically  over the last couple of days with associated orthopnea and worsening lower extremity edema.  She admits to nausea without vomiting this morning.  No fever or chills.  She denies any cough or wheezing.  No chest pain or palpitations.  When she came to the ER, she was afebrile and heart rate was 111 with a blood pressure 133/91 pulse 70 was 6 6 to 79% on room air and it came up to 98-high percent on BiPAP.  She was tachypneic to 24-29 and blood pressure later was 157/103.  Labs revealed mild hypokalemia of 3.2 BUN and creatinine of 22/1.2.  BNP was 877 and high-sensitivity troponin I was 460 and later 2523.  Lactic acid was 1.2.  Ferritin was 157 and CRP 2.2.  Procalcitonin was less than 0.1.  CBC showed mild anemia.  Influenza antigens and COVID-19 PCR came back negative.  Urinalysis showed more than 50 RBCs and rare bacteria with 0-5 WBCs.  Portable chest ray showed moderate bilateral pleural effusions with adjacent atelectasis and probably mild pulmonary edema and mild cardiomegaly.  EKG showed normal sinus rhythm with a rate of 97 with left bundle branch block.  The patient had an EKG at his cardiologist office on 12/13/2018 showed normal sinus rhythm then without documented left bundle branch block.  The patient was given 80 mg of IV Lasix.  The patient was initially placed on BiPAP and that was tapered down to nasal cannula and she did not tolerated she was placed on high flow nasal cannula at 10 L/min with adequate pulse oximetry.  Consult was made to Dr. Gwen Pounds who is aware about the patient.  He recommended IV heparin while holding off Plavix and Eliquis.  The patient will be admitted to stepdown unit for further evaluation and management."  Hospital Course:  Summary of her active problems in the hospital is as following.   1.  Acute non-STEMI. 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 Acute hypoxic respiratory failure. Aortic stenosis/severe. History of CAD SP CABG protection essential  hypertension. Dyslipidemia. PVD Cardiology consulted. EKG shows evidence of LBBB. Cardiology currently not planning any intervention today. Initially on IV heparin. Reducing clonidine dose to allow for diuresis and cardioprotective meds. D-dimer elevated currently on therapeutic anticoagulation.  Negative Doppler for DVT Continue Lipitor. Continue Coreg. Echocardiogram shows preserved EF. Underwent left heart catheterization which showed patent vessel with severe aortic stenosis. Cardiology recommending outpatient follow-up for further discussion of aortic valve replacement procedures.  Currently Norvasc is on hold, clonidine reduced from 0.2 mg twice daily 2.1 mg twice daily. Currently on lisinopril 40 mg daily. Metoprolol 100 mg daily transition to Coreg 25 mg twice daily.  2.  Anxiety Resolved. Likely with shortness of breath.   3.   hematuria Overactive bladder Initially had some mild pink-tinged urine. Resume Myrbetriq Outpatient follow-up with urology.   4.  Hypokalemia Corrected  5.  Acute kidney injury Likely cardiorenal hemodynamics. Improving with diuretics. Monitor closely.  6.  Chronic anticoagulation On Eliquis 2.5 mg BID at home, continue   7. Constipation  Continue miralax  Body mass index is 34.61 kg/m.   Patient was seen by physical therapy, who recommended SNF, which was arranged. On the day of the discharge the patient's vitals were stable, and no other acute medical condition were reported by patient. the patient was felt safe to be discharge at SNF with therapy.  Consultants: Cardiology  Procedures: Cardiac Catheterization  Echocardiogram   Discharge Exam: General: Appear in no distress, no Rash; Oral Mucosa Clear, moist. Cardiovascular: S1 and S2 Present, aortic systolic  Murmur, Respiratory: normal respiratory effort, Bilateral Air entry present and bilateral  Crackles, no wheezes Abdomen: Bowel Sound present, Soft and no tenderness,  no hernia Extremities: no Pedal edema, no calf tenderness Neurology: alert and oriented to time, place, and person affect appropriate.  Filed Weights   04/15/19 0450 04/15/19 0814 04/16/19 0516  Weight: 82.2 kg 82.2 kg 80.4 kg   Vitals:   04/16/19 0935 04/16/19 1207  BP:  108/66  Pulse:  91  Resp:    Temp:  99 F (37.2 C)  SpO2: 92% 99%    DISCHARGE MEDICATION: Allergies as of 04/16/2019      Reactions   Aspirin Palpitations   Per Pt: Makes heart flutter.      Medication List    STOP taking these medications   amLODipine 10 MG tablet Commonly known as: NORVASC   cloNIDine 0.2 mg/24hr patch Commonly known as: CATAPRES - Dosed in mg/24 hr Replaced by: cloNIDine 0.1 mg/24hr patch   lisinopril 40 MG tablet Commonly known as: ZESTRIL   metoprolol succinate 100 MG 24 hr tablet Commonly known as: TOPROL-XL     TAKE these medications   acetaminophen 325 MG tablet Commonly known as: TYLENOL Take 2 tablets (650 mg total) by mouth every 6 (six) hours as needed for mild pain (or Fever >/= 101).   atorvastatin 40 MG tablet Commonly known as: Lipitor Take 1 tablet (40 mg total) by mouth daily. What changed: Another medication with the same name was removed. Continue taking this medication, and follow the directions you see here.   carvedilol 25  MG tablet Commonly known as: COREG Take 1 tablet (25 mg total) by mouth 2 (two) times daily with a meal.   cloNIDine 0.1 mg/24hr patch Commonly known as: CATAPRES - Dosed in mg/24 hr Place 1 patch (0.1 mg total) onto the skin once a week. Start taking on: April 20, 2019 Replaces: cloNIDine 0.2 mg/24hr patch   Coenzyme Q10 100 MG capsule Take 1 capsule by mouth daily.   Eliquis 2.5 MG Tabs tablet Generic drug: apixaban Take 2.5 mg by mouth 2 (two) times daily.   furosemide 40 MG tablet Commonly known as: LASIX Take 1 tablet (40 mg total) by mouth 2 (two) times daily.   mirtazapine 15 MG tablet Commonly known as:  REMERON Take 15 mg by mouth at bedtime.   Myrbetriq 50 MG Tb24 tablet Generic drug: mirabegron ER Take 50 mg by mouth daily.   Plavix 75 MG tablet Generic drug: clopidogrel Take 75 mg by mouth daily.   polyethylene glycol 17 g packet Commonly known as: MIRALAX / GLYCOLAX Take 17 g by mouth daily.   potassium chloride SA 20 MEQ tablet Commonly known as: KLOR-CON Take 1 tablet (20 mEq total) by mouth daily.   senna-docusate 8.6-50 MG tablet Commonly known as: Senokot-S Take 1 tablet by mouth at bedtime as needed for mild constipation.   vitamin B-12 1000 MCG tablet Commonly known as: CYANOCOBALAMIN Take 1,000 mcg by mouth daily.            Durable Medical Equipment  (From admission, onward)         Start     Ordered   04/16/19 1037  For home use only DME lightweight manual wheelchair with seat cushion  Once    Comments: Patient suffers from CHF which impairs their ability to perform daily activities like bathing, dressing, feeding, grooming and toileting in the home.  A cane, crutch or walker will not resolve  issue with performing activities of daily living. A wheelchair will allow patient to safely perform daily activities. Patient is not able to propel themselves in the home using a standard weight wheelchair due to endurance and general weakness. Patient can self propel in the lightweight wheelchair. Length of need Lifetime. Accessories: elevating leg rests (ELRs), wheel locks, extensions and anti-tippers.   04/16/19 1037   04/16/19 0838  For home use only DME oxygen  Once    Question Answer Comment  Length of Need Lifetime   Mode or (Route) Nasal cannula   Liters per Minute 4   Frequency Continuous (stationary and portable oxygen unit needed)   Oxygen delivery system Gas      04/16/19 0838         Allergies  Allergen Reactions  . Aspirin Palpitations    Per Pt: Makes heart flutter.   Discharge Instructions    AMB Referral to Cardiac Rehabilitation -  Phase II   Complete by: As directed    Diagnosis: Heart Failure (see criteria below if ordering Phase II)   Heart Failure Type: Chronic Diastolic   Diet - low sodium heart healthy   Complete by: As directed    Discharge instructions   Complete by: As directed    It is important that you read the given instructions as well as go over your medication list with RN to help you understand your care after this hospitalization.  Please follow-up with PCP in 1-2 weeks.  Please note that NO REFILLS for any discharge medications will be authorized once you are discharged, as  it is imperative that you return to your primary care physician (or establish a relationship with a primary care physician if you do not have one) for your aftercare needs so that they can reassess your need for medications and monitor your lab values.  Please request your primary care physician to go over all Hospital Tests and Procedure/Radiological results at the follow up. Please get all Hospital records sent to your PCP by signing hospital release before you go home.   Do not drive, operating heavy machinery, perform activities at heights, swimming or participation in water activities or provide baby sitting services; until you have been seen by Primary Care Physician or a Neurologist and are cleared to do such activities.  Do not take more than prescribed Pain, Sleep and Anxiety Medications.  You were cared for by a hospitalist during your hospital stay. If you have any questions about your discharge medications or the care you received while you were in the hospital after you are discharged, you can call the unit @ you were admitted to and ask to speak with the hospitalist Lynden Oxford. Ask for Hospitalist on call if the hospitalist that took care of you is not available.   Once you are discharged, your primary care physician will handle any further medical issues.  You Must read complete instructions/literature along  with all the possible adverse reactions/side effects for all the Medicines you take and that have been prescribed to you. Take any new Medicines after you have completely understood and accept all the possible adverse reactions/side effects.  If you have smoked or chewed Tobacco in the last 2 yrs please STOP smoking STOP any Recreational drug use.  If you drink alcohol, please safely reduce the use. Do not drive, operating heavy machinery, perform activities at heights, swimming or participation in water activities or provide baby sitting services under influence.  Wear Seat belts while driving.   Increase activity slowly   Complete by: As directed       The results of significant diagnostics from this hospitalization (including imaging, microbiology, ancillary and laboratory) are listed below for reference.    Significant Diagnostic Studies: CARDIAC CATHETERIZATION  Result Date: 04/16/2019  Ost RCA to Prox RCA lesion is 55% stenosed.  Prox RCA lesion is 80% stenosed.  Mid RCA lesion is 100% stenosed.  Dist RCA lesion is 100% stenosed.  Prox Cx lesion is 100% stenosed.  Mid LAD lesion is 100% stenosed.  Prox LAD lesion is 80% stenosed.  Origin lesion is 100% stenosed.  79 year old female with known coronary artery disease status post coronary to bypass graft in the remote past with chronic nonvalvular atrial fibrillation and previous stroke hyperlipidemia hyperlipidemia peripheral vascular disease hypertension and bundle branch block who has had an acute onset of non-ST elevation myocardial infarction with a troponin of 5085 and chronic diastolic and acute diastolic dysfunction heart failure. Cardiac catheterization showing normal LV systolic function with increased left ventricular end-diastolic pressures consistent with diastolic dysfunction with ejection fraction of 65% PA pressures of 49/24 with a mean of 35 mm consistent with mild pulmonary hypertension Pulmonary capillary wedge pressure  mean of 19 Aortic pressure of 110/63 with left ventricular pressures of 161/18 having a peak gradient of 50 mm Aortic valve area 0.8 cm Echocardiogram showing normal LV systolic function with a peak gradient of 77 mm mean gradient of 42 mm and an aortic velocity of 4.4 m/s LIMA to left anterior descending artery patent SVG to obtuse marginal 1 patent SVG  to PDA occluded with collateral blood flow from distal LAD to PDA Mid right coronary artery, proximal left circumflex artery, mid left anterior descending artery occluded Assessment 79 year old female with acute on chronic diastolic dysfunction congestive heart failure with non-ST ovation myocardial infarction and no apparent acute coronary syndrome with culprit arterial or graft changes from previous cardiac catheterization consistent with demand ischemia and severe aortic valve stenosis Plan No further cardiac intervention at this time due to no evidence of acute coronary syndrome Continue medication management for coronary artery disease with aspirin 81 mg Diuretics beta-blocker ACE inhibitor for diastolic dysfunction congestive heart failure Anticoagulation for further risk reduction of stroke with atrial fibrillation Ambulation and follow for improvements of symptoms Cardiac rehabilitation Further consideration discussion with cardiovascular surgery TAVR team to assess appropriateness for TAVR   US Venous Img Lower Bilateral (DVT)  Result Date: 04/13/2019 CLINICAL DATA:  Lower extremity edema for years. Patient is on Eliquis. EXAM: BILATERAL LOWER EXTREMITY VENOUS DOPPLER ULTRASOUND TECHNIQUE: Gray-scale sonography with compression, as well as color and duplex ultrasound, were performed to evaluate the deep venous system(s) from the level of the common femoral vein through the popliteal and proximal calf veins. COMPARISON:  None. FINDINGS: VENOUS Normal compressibility of the common femoral, superficial femoral, and popliteal veins, as well as the  visualized calf veins. Visualized portions of profunda femoral vein and great saphenous vein unremarkable. No filling defects to suggest DVT on grayscale or color Doppler imaging. Doppler waveforms show normal direction of venous flow, normal respiratory phasicity and response to augmentation. Limited views of the contralateral common femoral vein are unremarkable. OTHER None. Limitations: none IMPRESSION: No femoropopliteal DVT nor evidence of DVT within the visualized calf veins. If clinical symptoms are inconsistent or if there are persistent or worsening symptoms, further imaging (possibly involving the iliac veins) may be warranted. Electronically Signed   By: Amie Portland M.D.   On: 04/13/2019 16:01   DG Chest Port 1 View  Result Date: 04/13/2019 CLINICAL DATA:  SOB per physician order. Per physician notes - Patient presented with complaints of cough and shortness of breath. Found to have non-STEMI and acute hypoxic respiratory failure.Currently further plan is continue aggressive diuresis. Pt admitted yesterday for acute non-STEMI, acute on chronic possibly diastolic CHF, acute hypoxic respiratory failure. Hx - A-fib, stroke, CABG 1980, former smoker. EXAM: PORTABLE CHEST 1 VIEW COMPARISON:  04/12/2019 FINDINGS: Stable cardiomegaly. Moderate, left greater than right, pleural effusions. There are linear opacities in the left mid lung most likely atelectasis. No new lung abnormalities.  No pneumothorax. IMPRESSION: 1. Cardiomegaly with moderate bilateral pleural effusions, left greater than right, associated with atelectasis. Underlying pneumonia is not excluded. No convincing pulmonary edema. Electronically Signed   By: Amie Portland M.D.   On: 04/13/2019 16:00   DG Chest Portable 1 View  Result Date: 04/12/2019 CLINICAL DATA:  Shortness of breath EXAM: PORTABLE CHEST 1 VIEW COMPARISON:  None. FINDINGS: Moderate bilateral pleural effusions with adjacent atelectasis. Probable mild pulmonary edema. No  pneumothorax. Mild cardiomegaly. IMPRESSION: Moderate bilateral pleural effusions with adjacent atelectasis. Probable mild pulmonary edema. Mild cardiomegaly. Electronically Signed   By: Guadlupe Spanish M.D.   On: 04/12/2019 16:52   DG Abd Portable 1V  Result Date: 04/16/2019 CLINICAL DATA:  Abdominal discomfort EXAM: PORTABLE ABDOMEN - 1 VIEW COMPARISON:  None FINDINGS: Bowel gas pattern is remarkable for sign of colonic diverticulosis in the sigmoid colon. Urinary bladder obscures the rectum. No signs of bowel obstruction. Dense nephrograms bilaterally with excreted contrast  in the urinary bladder. No priors are available to confirm exam type or timing of the previous study. Question of nephrolithiasis on the right and left, limited assessment due to bilateral nephrograms. 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 Spinal degenerative changes without acute or destructive bone process. IMPRESSION: 1. Nonobstructive bowel gas pattern. 2. Colonic diverticulosis. 3. Dense nephrograms bilaterally with excreted contrast in the urinary bladder. Correlate with timing of a contrasted evaluation which has been recently performed. Also correlation with renal function given dense bilateral nephrograms. 4. Query nephrolithiasis. 5. Signs of cholecystectomy. 6. 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 7. These results will be called to the ordering clinician or representative by the Radiologist Assistant, and communication documented in the PACS or [employer]. Electronically Signed   By: Donzetta Kohut M.D.   On: 04/16/2019 10:28   ECHOCARDIOGRAM COMPLETE  Result Date: 04/14/2019    ECHOCARDIOGRAM REPORT   Patient Name:   Valerie Trujillo Date of Exam: 04/13/2019 Medical Rec #:  [id number]   Height:       60.0 in Accession #:    [id number]  Weight:       178.6 lb Date of Birth:  21-Mar-1940   BSA:          1.779 m Patient Age:    78 years    BP:           137/75 mmHg  Patient Gender: F           HR:           84 bpm. Exam Location:  [facility] Procedure: 2D Echo Indications:     Acute Diastolic CHF  History:         Patient has prior history of Echocardiogram examinations. Prior                  CABG, Stroke; Arrythmias:Atrial Fibrillation.  Sonographer:     LTM Referring Phys:  [id number] Valerie Trujillo Diagnosing Phys: Arnoldo Hooker MD IMPRESSIONS  1. Left ventricular ejection fraction, by estimation, is 60 to 65%. The left ventricle has normal function. The left ventricle has no regional wall motion abnormalities. There is moderate left ventricular hypertrophy. Left ventricular diastolic parameters are consistent with Grade I diastolic dysfunction (impaired relaxation).  2. Right ventricular systolic function is normal. The right ventricular size is normal. There is moderately elevated pulmonary artery systolic pressure.  3. Left atrial size was moderately dilated.  4. Right atrial size was moderately dilated.  5. The mitral valve is normal in structure. Moderate mitral valve regurgitation.  6. Tricuspid valve regurgitation is moderate.  7. The aortic valve is grossly normal. Aortic valve regurgitation is mild. Severe aortic valve stenosis. FINDINGS  Left Ventricle: Left ventricular ejection fraction, by estimation, is 60 to 65%. The left ventricle has normal function. The left ventricle has no regional wall motion abnormalities. The left ventricular internal cavity size was normal in size. There is  moderate left ventricular hypertrophy. Left ventricular diastolic parameters are consistent with Grade I diastolic dysfunction (impaired relaxation). Right Ventricle: The right ventricular size is normal. No increase in right ventricular wall thickness. Right ventricular systolic function is normal. There is moderately elevated pulmonary artery systolic pressure. The tricuspid regurgitant velocity is 2.81 m/s, and with an assumed right atrial pressure of 10 mmHg, the estimated right  ventricular systolic pressure is 41.6 mmHg. Left Atrium: Left atrial size was moderately dilated. Right Atrium: Right atrial size was moderately  dilated. Pericardium: There is no evidence of pericardial effusion. Mitral Valve: The mitral valve is normal in structure. Moderate mitral valve regurgitation. MV peak gradient, 13.5 mmHg. The mean mitral valve gradient is 5.0 mmHg. Tricuspid Valve: The tricuspid valve is normal in structure. Tricuspid valve regurgitation is moderate. Aortic Valve: The aortic valve is grossly normal. Aortic valve regurgitation is mild. Aortic regurgitation PHT measures 477 msec. Severe aortic stenosis is present. Aortic valve mean gradient measures 53.5 mmHg. Aortic valve peak gradient measures 100.8 mmHg. Aortic valve area, by VTI measures 0.53 cm. Pulmonic Valve: The pulmonic valve was normal in structure. Pulmonic valve regurgitation is not visualized. Aorta: The aortic root and ascending aorta are structurally normal, with no evidence of dilitation. IAS/Shunts: No atrial level shunt detected by color flow Doppler.  LEFT VENTRICLE PLAX 2D LVIDd:         4.20 cm  Diastology LVIDs:         2.56 cm  LV e' lateral:   4.79 cm/s LV PW:         1.30 cm  LV E/e' lateral: 31.9 LV IVS:        1.42 cm  LV e' medial:    3.37 cm/s LVOT diam:     2.10 cm  LV E/e' medial:  45.4 LV SV:         53 LV SV Index:   30 LVOT Area:     3.46 cm  LEFT ATRIUM              Index       RIGHT ATRIUM           Index LA Vol (A2C):   115.0 ml 64.65 ml/m RA Area:     35.80 cm LA Vol (A4C):   96.8 ml  54.42 ml/m RA Volume:   138.00 ml 77.58 ml/m LA Biplane Vol: 112.0 ml 62.96 ml/m  AORTIC VALVE AV Area (Vmax):    0.56 cm AV Area (Vmean):   0.61 cm AV Area (VTI):     0.53 cm AV Vmax:           502.00 cm/s AV Vmean:          337.500 cm/s AV VTI:            1.000 m AV Peak Grad:      100.8 mmHg AV Mean Grad:      53.5 mmHg LVOT Vmax:         80.50 cm/s LVOT Vmean:        59.000 cm/s LVOT VTI:          0.152 m  LVOT/AV VTI ratio: 0.15 AI PHT:            477 msec  AORTA Ao Root diam: 3.60 cm MITRAL VALVE                TRICUSPID VALVE MV Area (PHT): 2.87 cm     TR Peak grad:   31.6 mmHg MV Peak grad:  13.5 mmHg    TR Vmax:        281.00 cm/s MV Mean grad:  5.0 mmHg MV Vmax:       1.84 m/s     SHUNTS MV Vmean:      108.0 cm/s   Systemic VTI:  0.15 m MV Decel Time: 264 msec     Systemic Diam: 2.10 cm MV E velocity: 153.00 cm/s Serafina Royals MD Electronically signed by Serafina Royals MD Signature Date/Time: 04/14/2019/7:22:57  AM    Final     Microbiology: Recent Results (from the past 240 hour(s))  Culture, blood (routine x 2)     Status: None (Preliminary result)   Collection Time: 04/12/19  5:33 PM   Specimen: BLOOD  Result Value Ref Range Status   Specimen Description BLOOD BLOOD LEFT ARM  Final   Special Requests   Final    BOTTLES DRAWN AEROBIC AND ANAEROBIC Blood Culture adequate volume   Culture   Final    NO GROWTH 4 DAYS Performed at [facility], [address] [place], [place] [address]    Report Status PENDING  Incomplete  Culture, blood (routine x 2)     Status: None (Preliminary result)   Collection Time: 04/12/19  7:42 PM   Specimen: BLOOD  Result Value Ref Range Status   Specimen Description BLOOD LEFT HAND  Final   Special Requests   Final    BOTTLES DRAWN AEROBIC AND ANAEROBIC Blood Culture adequate volume   Culture   Final    NO GROWTH 4 DAYS Performed at [facility], [address] [place], [place] [address]    Report Status PENDING  Incomplete  Respiratory Panel by RT PCR (Flu A&B, Covid) - Nasopharyngeal Swab     Status: None   Collection Time: 04/12/19  7:54 PM   Specimen: Nasopharyngeal Swab  Result Value Ref Range Status   SARS Coronavirus 2 by RT PCR NEGATIVE NEGATIVE Final    Comment: (NOTE) SARS-CoV-2 target nucleic acids are NOT DETECTED. The SARS-CoV-2 RNA is generally detectable in upper respiratoy specimens during the acute phase of  infection. The lowest concentration of SARS-CoV-2 viral copies this assay can detect is 131 copies/mL. A negative result does not preclude SARS-Cov-2 infection and should not be used as the sole basis for treatment or other patient management decisions. A negative result may occur with  improper specimen collection/handling, submission of specimen other than nasopharyngeal swab, presence of viral mutation(s) within the areas targeted by this assay, and inadequate number of viral copies (<131 copies/mL). A negative result must be combined with clinical observations, patient history, and epidemiological information. The expected result is Negative. Fact Sheet for Patients:  https://www.moore.com/https://www.fda.gov/media/142436/download Fact Sheet for Healthcare Providers:  https://www.young.biz/https://www.fda.gov/media/142435/download This test is not yet ap proved or cleared by the [place] FDA and  has been authorized for detection and/or diagnosis of SARS-CoV-2 by FDA under an Emergency Use Authorization (EUA). This EUA will remain  in effect (meaning this test can be used) for the duration of the COVID-19 declaration under Section 564(b)(1) of the Act, 21 U.S.C. section 360bbb-3(b)(1), unless the authorization is terminated or revoked sooner.    Influenza A by PCR NEGATIVE NEGATIVE Final   Influenza B by PCR NEGATIVE NEGATIVE Final    Comment: (NOTE) The Xpert Xpress SARS-CoV-2/FLU/RSV assay is intended as an aid in  the diagnosis of influenza from Nasopharyngeal swab specimens and  should not be used as a sole basis for treatment. Nasal washings and  aspirates are unacceptable for Xpert Xpress SARS-CoV-2/FLU/RSV  testing. Fact Sheet for Patients: https://www.moore.com/https://www.fda.gov/media/142436/download Fact Sheet for Healthcare Providers: https://www.young.biz/https://www.fda.gov/media/142435/download This test is not yet approved or cleared by the [place] FDA and  has been authorized for detection and/or diagnosis of SARS-CoV-2 by  FDA under  an Emergency Use Authorization (EUA). This EUA will remain  in effect (meaning this test can be used) for the duration of the  Covid-19 declaration under Section 564(b)(1) of the Act, 21  U.S.C. section 360bbb-3(b)(1), unless the authorization is  terminated or revoked. Performed at [facility], [address] [place], [place] [address]   MRSA PCR Screening     Status: None   Collection Time: 04/12/19 10:07 PM   Specimen: Nasopharyngeal  Result Value Ref Range Status   MRSA by PCR NEGATIVE NEGATIVE Final    Comment:        The GeneXpert MRSA Assay (FDA approved for NASAL specimens only), is one component of a comprehensive MRSA colonization surveillance program. It is not intended to diagnose MRSA infection nor to guide or monitor treatment for MRSA infections. Performed at [facility], [address] [place], [place] [address]      Labs: CBC: Recent Labs  Lab 04/12/19 1727 04/12/19 1727 04/13/19 0541 04/14/19 0504 04/15/19 0503 04/16/19 0327 04/16/19 0959  WBC 8.8   < > 9.7 8.5 8.8 8.8 8.8  NEUTROABS 7.6  --  7.4 6.1 6.3 6.5  --   HGB 11.4*   < > 12.3 11.7* 11.7* 11.7* 11.8*  HCT 34.5*   < > 37.3 35.4* 35.0* 34.4* 36.3  MCV 95.6   < > 95.6 95.7 96.4 95.6 98.6  PLT 201   < > 188 208 212 212 202   < > = values in this interval not displayed.   Basic Metabolic Panel: Recent Labs  Lab 04/12/19 1727 04/12/19 2142 04/13/19 0541 04/14/19 0504 04/15/19 0503 04/16/19 0327  NA 139  --  142 141 141 143  K 3.2*  --  3.6 3.3* 3.5 3.7  CL 106  --  106 101 98 99  CO2 22  --  26 26 33* 30  GLUCOSE 141*  --  98 104* 116* 126*  BUN 22  --  CREATININE 1.20*  --  0.95 0.86 0.97 1.17*  CALCIUM 8.8*  --  8.8* 8.8* 9.1 8.9  MG  --  1.8  --   --   --   --    Liver Function Tests: Recent Labs  Lab 04/12/19 1727  AST 26  ALT 14  ALKPHOS 62  BILITOT 1.6*  PROT 7.3  ALBUMIN 3.6   No results for input(s): LIPASE, AMYLASE in the last  168 hours. No results for input(s): AMMONIA in the last 168 hours. Cardiac Enzymes: No results for input(s): CKTOTAL, CKMB, CKMBINDEX, TROPONINI in the last 168 hours. BNP (last 3 results) Recent Labs    04/12/19 1942 04/13/19 1427  BNP 877.0* 573.0*   CBG: Recent Labs  Lab 04/12/19 2127 04/15/19 1445  GLUCAP 113* 142*    Time spent: 35 minutes  Signed:  Lynden Oxford  [facility]  04/16/2019 2:20 PM

## 2019-04-16 NOTE — Progress Notes (Signed)
[facility] PROGRESS NOTE  Patient: Valerie Trujillo GNO:[id number]   PCP: Woodroe Chen, MD DOB: 12-25-1940   DOA: 04/12/2019   DOS: 04/16/2019    Holding discharge due to hypotension. Discontinue clonidine patch for now.  Monitor.  Author: Lynden Oxford, MD [facility] 04/16/2019 7:51 PM   If 7PM-7AM, please contact night-coverage at [email or web address]

## 2019-04-16 NOTE — Progress Notes (Signed)
[facility] Cardiology [facility] Encounter Note  Patient: Valerie Trujillo / Admit Date: 04/12/2019 / Date of Encounter: 04/16/2019, 8:53 AM   Subjective: Patient much better today with less congestive heart failure pulmonary edema shortness of breath and no current evidence of chest pain.  Patient has had a non-ST elevation myocardial infarction with a troponin of 5085. Echocardiogram showing normal LV systolic function with ejection fraction of 55% with the severe aortic stenosis and a peak velocity of 77 mm mean velocity of 42 mm and gradient of 4.4 m/s Patient does have atrial fibrillation with controlled ventricular rate and bundle branch block unchanged from before Cardiac cath with severe aortic stenosis and patetn lima to lad svg to om1 and chronically occluded rca and svg to rca with collateralls seen Lv function normal Review of Systems: Positive for: Shortness of breath Negative for: Vision change, hearing change, syncope, dizziness, nausea, vomiting,diarrhea, bloody stool, stomach pain, cough, congestion, diaphoresis, urinary frequency, urinary pain,skin lesions, skin rashes Others previously listed  Objective: Telemetry: Atrial fibrillation with bundle branch block Physical Exam: Blood pressure 120/77, pulse 99, temperature 99.1 F (37.3 C), temperature source Oral, resp. rate 16, height 5' (1.524 m), weight 80.4 kg, SpO2 100 %. Body mass index is 34.61 kg/m. General: Well developed, well nourished, in no acute distress. Head: Normocephalic, atraumatic, sclera non-icteric, no xanthomas, nares are without discharge. Neck: No apparent masses Lungs: Normal respirations with few wheezes, no rhonchi, no rales , no crackles   Heart: Irregular rate and rhythm, normal S1 SOFT S2, 3+ aortic murmur, no rub, no gallop, PMI is normal size and placement, carotid upstroke normal without bruit, jugular venous pressure normal Abdomen: Soft, non-tender, non-distended with normoactive bowel sounds.  No hepatosplenomegaly. Abdominal aorta is normal size without bruit Extremities: Trace edema, no clubbing, no cyanosis, no ulcers,  Peripheral: 2+ radial, 2+ femoral, 1+ dorsal pedal pulses Neuro: Alert and oriented. Moves all extremities spontaneously. Psych:  Responds to questions appropriately with a normal affect.   Intake/Output Summary (Last 24 hours) at 04/16/2019 0853 Last data filed at 04/16/2019 0521 Gross per 24 hour  Intake 169.74 ml  Output 1125 ml  Net -955.26 ml    Inpatient Medications:  . apixaban  2.5 mg Oral BID  . atorvastatin  40 mg Oral Daily  . carvedilol  25 mg Oral BID WC  . cloNIDine  0.1 mg Transdermal Weekly  . furosemide  40 mg Oral BID  . mirtazapine  15 mg Oral QHS  . potassium chloride  20 mEq Oral BID  . sodium chloride flush  3 mL Intravenous Q12H  . sodium chloride flush  3 mL Intravenous Q12H   Infusions:  . sodium chloride      Labs: Recent Labs    04/15/19 0503 04/16/19 0327  NA 141 143  K 3.5 3.7  CL 98 99  CO2 33* 30  GLUCOSE 116* 126*  BUN 19 20  CREATININE 0.97 1.17*  CALCIUM 9.1 8.9   No results for input(s): AST, ALT, ALKPHOS, BILITOT, PROT, ALBUMIN in the last 72 hours. Recent Labs    04/15/19 0503 04/16/19 0327  WBC 8.8 8.8  NEUTROABS 6.3 6.5  HGB 11.7* 11.7*  HCT 35.0* 34.4*  MCV 96.4 95.6  PLT 212 212   No results for input(s): CKTOTAL, CKMB, TROPONINI in the last 72 hours. Invalid input(s): POCBNP No results for input(s): HGBA1C in the last 72 hours.   Weights: Filed Weights   04/15/19 0450 04/15/19 0814 04/16/19 0516  Weight:  82.2 kg 82.2 kg 80.4 kg     Radiology/Studies:  CARDIAC CATHETERIZATION  Result Date: 04/16/2019  Ost RCA to Prox RCA lesion is 55% stenosed.  Prox RCA lesion is 80% stenosed.  Mid RCA lesion is 100% stenosed.  Dist RCA lesion is 100% stenosed.  Prox Cx lesion is 100% stenosed.  Mid LAD lesion is 100% stenosed.  Prox LAD lesion is 80% stenosed.  Origin lesion is 100%  stenosed.  79 year old female with known coronary artery disease status post coronary to bypass graft in the remote past with chronic nonvalvular atrial fibrillation and previous stroke hyperlipidemia hyperlipidemia peripheral vascular disease hypertension and bundle branch block who has had an acute onset of non-ST elevation myocardial infarction with a troponin of 5085 and chronic diastolic and acute diastolic dysfunction heart failure. Cardiac catheterization showing normal LV systolic function with increased left ventricular end-diastolic pressures consistent with diastolic dysfunction with ejection fraction of 65% PA pressures of 49/24 with a mean of 35 mm consistent with mild pulmonary hypertension Pulmonary capillary wedge pressure mean of 19 Aortic pressure of 110/63 with left ventricular pressures of 161/18 having a peak gradient of 50 mm Aortic valve area 0.8 cm Echocardiogram showing normal LV systolic function with a peak gradient of 77 mm mean gradient of 42 mm and an aortic velocity of 4.4 m/s LIMA to left anterior descending artery patent SVG to obtuse marginal 1 patent SVG to PDA occluded with collateral blood flow from distal LAD to PDA Mid right coronary artery, proximal left circumflex artery, mid left anterior descending artery occluded Assessment 79 year old female with acute on chronic diastolic dysfunction congestive heart failure with non-ST ovation myocardial infarction and no apparent acute coronary syndrome with culprit arterial or graft changes from previous cardiac catheterization consistent with demand ischemia and severe aortic valve stenosis Plan No further cardiac intervention at this time due to no evidence of acute coronary syndrome Continue medication management for coronary artery disease with aspirin 81 mg Diuretics beta-blocker ACE inhibitor for diastolic dysfunction congestive heart failure Anticoagulation for further risk reduction of stroke with atrial fibrillation  Ambulation and follow for improvements of symptoms Cardiac rehabilitation Further consideration discussion with cardiovascular surgery TAVR team to assess appropriateness for TAVR   US Venous Img Lower Bilateral (DVT)  Result Date: 04/13/2019 CLINICAL DATA:  Lower extremity edema for years. Patient is on Eliquis. EXAM: BILATERAL LOWER EXTREMITY VENOUS DOPPLER ULTRASOUND TECHNIQUE: Gray-scale sonography with compression, as well as color and duplex ultrasound, were performed to evaluate the deep venous system(s) from the level of the common femoral vein through the popliteal and proximal calf veins. COMPARISON:  None. FINDINGS: VENOUS Normal compressibility of the common femoral, superficial femoral, and popliteal veins, as well as the visualized calf veins. Visualized portions of profunda femoral vein and great saphenous vein unremarkable. No filling defects to suggest DVT on grayscale or color Doppler imaging. Doppler waveforms show normal direction of venous flow, normal respiratory phasicity and response to augmentation. Limited views of the contralateral common femoral vein are unremarkable. OTHER None. Limitations: none IMPRESSION: No femoropopliteal DVT nor evidence of DVT within the visualized calf veins. If clinical symptoms are inconsistent or if there are persistent or worsening symptoms, further imaging (possibly involving the iliac veins) may be warranted. Electronically Signed   By: Amie Portland M.D.   On: 04/13/2019 16:01   DG Chest Port 1 View  Result Date: 04/13/2019 CLINICAL DATA:  SOB per physician order. Per physician notes - Patient presented with complaints of cough  and shortness of breath. Found to have non-STEMI and acute hypoxic respiratory failure.Currently further plan is continue aggressive diuresis. Pt admitted yesterday for acute non-STEMI, acute on chronic possibly diastolic CHF, acute hypoxic respiratory failure. Hx - A-fib, stroke, CABG 1980, former smoker. EXAM: PORTABLE  CHEST 1 VIEW COMPARISON:  04/12/2019 FINDINGS: Stable cardiomegaly. Moderate, left greater than right, pleural effusions. There are linear opacities in the left mid lung most likely atelectasis. No new lung abnormalities.  No pneumothorax. IMPRESSION: 1. Cardiomegaly with moderate bilateral pleural effusions, left greater than right, associated with atelectasis. Underlying pneumonia is not excluded. No convincing pulmonary edema. Electronically Signed   By: Amie Portlandavid  Ormond M.D.   On: 04/13/2019 16:00   DG Chest Portable 1 View  Result Date: 04/12/2019 CLINICAL DATA:  Shortness of breath EXAM: PORTABLE CHEST 1 VIEW COMPARISON:  None. FINDINGS: Moderate bilateral pleural effusions with adjacent atelectasis. Probable mild pulmonary edema. No pneumothorax. Mild cardiomegaly. IMPRESSION: Moderate bilateral pleural effusions with adjacent atelectasis. Probable mild pulmonary edema. Mild cardiomegaly. Electronically Signed   By: Guadlupe SpanishPraneil  Patel M.D.   On: 04/12/2019 16:52   ECHOCARDIOGRAM COMPLETE  Result Date: 04/14/2019    ECHOCARDIOGRAM REPORT   Patient Name:   Valerie Trujillo Date of Exam: 04/13/2019 Medical Rec #:  [id number]   Height:       60.0 in Accession #:    [id number]  Weight:       178.6 lb Date of Birth:  1940-05-04   BSA:          1.779 m Patient Age:    78 years    BP:           137/75 mmHg Patient Gender: F           HR:           84 bpm. Exam Location:  [facility] Procedure: 2D Echo Indications:     Acute Diastolic CHF  History:         Patient has prior history of Echocardiogram examinations. Prior                  CABG, Stroke; Arrythmias:Atrial Fibrillation.  Sonographer:     LTM Referring Phys:  [id number] Vernetta HoneyJAN A MANSY Diagnosing Phys: Arnoldo HookerBruce Jerrye Seebeck MD IMPRESSIONS  1. Left ventricular ejection fraction, by estimation, is 60 to 65%. The left ventricle has normal function. The left ventricle has no regional wall motion abnormalities. There is moderate left ventricular hypertrophy. Left ventricular diastolic  parameters are consistent with Grade I diastolic dysfunction (impaired relaxation).  2. Right ventricular systolic function is normal. The right ventricular size is normal. There is moderately elevated pulmonary artery systolic pressure.  3. Left atrial size was moderately dilated.  4. Right atrial size was moderately dilated.  5. The mitral valve is normal in structure. Moderate mitral valve regurgitation.  6. Tricuspid valve regurgitation is moderate.  7. The aortic valve is grossly normal. Aortic valve regurgitation is mild. Severe aortic valve stenosis. FINDINGS  Left Ventricle: Left ventricular ejection fraction, by estimation, is 60 to 65%. The left ventricle has normal function. The left ventricle has no regional wall motion abnormalities. The left ventricular internal cavity size was normal in size. There is  moderate left ventricular hypertrophy. Left ventricular diastolic parameters are consistent with Grade I diastolic dysfunction (impaired relaxation). Right Ventricle: The right ventricular size is normal. No increase in right ventricular wall thickness. Right ventricular systolic function is normal. There is moderately elevated pulmonary artery systolic pressure. The tricuspid regurgitant  velocity is 2.81 m/s, and with an assumed right atrial pressure of 10 mmHg, the estimated right ventricular systolic pressure is 41.6 mmHg. Left Atrium: Left atrial size was moderately dilated. Right Atrium: Right atrial size was moderately dilated. Pericardium: There is no evidence of pericardial effusion. Mitral Valve: The mitral valve is normal in structure. Moderate mitral valve regurgitation. MV peak gradient, 13.5 mmHg. The mean mitral valve gradient is 5.0 mmHg. Tricuspid Valve: The tricuspid valve is normal in structure. Tricuspid valve regurgitation is moderate. Aortic Valve: The aortic valve is grossly normal. Aortic valve regurgitation is mild. Aortic regurgitation PHT measures 477 msec. Severe aortic  stenosis is present. Aortic valve mean gradient measures 53.5 mmHg. Aortic valve peak gradient measures 100.8 mmHg. Aortic valve area, by VTI measures 0.53 cm. Pulmonic Valve: The pulmonic valve was normal in structure. Pulmonic valve regurgitation is not visualized. Aorta: The aortic root and ascending aorta are structurally normal, with no evidence of dilitation. IAS/Shunts: No atrial level shunt detected by color flow Doppler.  LEFT VENTRICLE PLAX 2D LVIDd:         4.20 cm  Diastology LVIDs:         2.56 cm  LV e' lateral:   4.79 cm/s LV PW:         1.30 cm  LV E/e' lateral: 31.9 LV IVS:        1.42 cm  LV e' medial:    3.37 cm/s LVOT diam:     2.10 cm  LV E/e' medial:  45.4 LV SV:         53 LV SV Index:   30 LVOT Area:     3.46 cm  LEFT ATRIUM              Index       RIGHT ATRIUM           Index LA Vol (A2C):   115.0 ml 64.65 ml/m RA Area:     35.80 cm LA Vol (A4C):   96.8 ml  54.42 ml/m RA Volume:   138.00 ml 77.58 ml/m LA Biplane Vol: 112.0 ml 62.96 ml/m  AORTIC VALVE AV Area (Vmax):    0.56 cm AV Area (Vmean):   0.61 cm AV Area (VTI):     0.53 cm AV Vmax:           502.00 cm/s AV Vmean:          337.500 cm/s AV VTI:            1.000 m AV Peak Grad:      100.8 mmHg AV Mean Grad:      53.5 mmHg LVOT Vmax:         80.50 cm/s LVOT Vmean:        59.000 cm/s LVOT VTI:          0.152 m LVOT/AV VTI ratio: 0.15 AI PHT:            477 msec  AORTA Ao Root diam: 3.60 cm MITRAL VALVE                TRICUSPID VALVE MV Area (PHT): 2.87 cm     TR Peak grad:   31.6 mmHg MV Peak grad:  13.5 mmHg    TR Vmax:        281.00 cm/s MV Mean grad:  5.0 mmHg MV Vmax:       1.84 m/s     SHUNTS MV Vmean:      108.0  cm/s   Systemic VTI:  0.15 m MV Decel Time: 264 msec     Systemic Diam: 2.10 cm MV E velocity: 153.00 cm/s Arnoldo Hooker MD Electronically signed by Arnoldo Hooker MD Signature Date/Time: 04/14/2019/7:22:57 AM    Final      Assessment and Recommendation  79 y.o. female with known coronary artery disease status  post coronary bypass graft hypertension hyperlipidemia atrial fibrillation bundle branch block peripheral vascular disease hypertension with a non-ST elevation myocardial infarction and diastolic dysfunction congestive heart failure now slightly improved With nstemi likley due to diastolic dysfx chf and aortic stenosis rather than acs 1.  Okay for discontinuation of heparin and changing back over to Eliquis 2.5 mg twice per day.  Okay for aspirin 81 mg in addition for coronary artery disease as well.  No use of triple therapy due to concerns of bleeding complications 2.  Carvedilol for heart rate control and congestive heart failure 3.  Furosemide for congestive heart failure multifactorial in nature 4. Further consider ation of aortic valve replacement after discharge with follow-up with Dr. Lady Gary to assess need for aortic valve surgery with TAVR.  Will consult with TAVR team to assess appropriateness 5.  Okay for discharge home today if ambulating well no further significant symptoms on current medical regimen with follow-up in 1 to 2 weeks  Signed, Arnoldo Hooker M.D. FACC

## 2019-04-16 NOTE — Progress Notes (Addendum)
Physical Therapy Treatment Patient Details Name: Valerie Trujillo MRN: [id number] DOB: May 29, 1940 Today's Date: 04/16/2019    History of Present Illness Valerie Trujillo is a 64yoF who comes to [facility] on 3/12 c 4d progressive SOB. Pt admitted with suspected NSTEMI. Underwent cardiac cath on 04/15/19. 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    PT Comments    Author returns for treatment session, pt agreeable to treatment, reports to continue to have loose stool incontinence. MaxA for mobility to EOB, pt remains incredibly weak. Max+2 Assist for transfer to [facility], then to standing to AMB to recliner. Establishing standing balance requires >30sec d/t continued weakness/stiffness of BLE. Pt makes it to recliner 51f away, but additional distance unsafe due to severe weakness. Pt trialed on Room air for mobility and AMB, drops to 85% after transition to recliner. HR remains well controlled throughout session, some wide rate variability from AF, but remains 90s -110s. Pt requiring too much physical assistance for return to home, niece unable to provide this level of care. Pt previously AMB household distances without device, author anticipates quick recover in the STR setting with fast transition back to home.    Follow Up Recommendations  Supervision for mobility/OOB;SNF;Supervision - Intermittent     Equipment Recommendations  None recommended by PT(Can be met by facility.)    Recommendations for Other Services       Precautions / Restrictions Precautions Precautions: Fall Restrictions Weight Bearing Restrictions: No    Mobility  Bed Mobility Overal bed mobility: Needs Assistance Bed Mobility: Supine to Sit     Supine to sit: Max assist Sit to supine: Max assist   General bed mobility comments: Remains very weak, legs are tender.  Transfers Overall transfer level: Needs assistance Equipment used: Rolling walker (2 wheeled) Transfers:  Sit to/from SOmnicareSit to Stand: Max assist Stand pivot transfers: Max assist       General transfer comment: very weak, maintains genu valgus and partial flexion in full stance.  Ambulation/Gait Ambulation/Gait assistance: Min guard Gait Distance (Feet): 6 Feet Assistive device: Rolling walker (2 wheeled) Gait Pattern/deviations: WFL(Within Functional Limits)     General Gait Details: weak, limited trunk control, desaturation on room air to 85%   Stairs             Wheelchair Mobility    Modified Rankin (Stroke Patients Only)       Balance Overall balance assessment: Needs assistance Sitting-balance support: Bilateral upper extremity supported;Feet supported;Feet unsupported Sitting balance-Leahy Scale: Poor     Standing balance support: Bilateral upper extremity supported;During functional activity Standing balance-Leahy Scale: Fair                              Cognition Arousal/Alertness: Awake/alert Behavior During Therapy: WFL for tasks assessed/performed Overall Cognitive Status: Within Functional Limits for tasks assessed                                        Exercises      General Comments        Pertinent Vitals/Pain Pain Assessment: (ABD tightness/fullness)    Home Living                      Prior Function  PT Goals (current goals can now be found in the care plan section) Acute Rehab PT Goals Patient Stated Goal: regain strength and independence in mobility PT Goal Formulation: With patient Time For Goal Achievement: 04/30/19 Potential to Achieve Goals: Fair Progress towards PT goals: Not progressing toward goals - comment    Frequency    Min 2X/week      PT Plan Discharge plan needs to be updated    Co-evaluation              AM-PAC PT "6 Clicks" Mobility   Outcome Measure  Help needed turning from your back to your side while in a flat bed  without using bedrails?: Total Help needed moving from lying on your back to sitting on the side of a flat bed without using bedrails?: Total Help needed moving to and from a bed to a chair (including a wheelchair)?: Total Help needed standing up from a chair using your arms (e.g., wheelchair or bedside chair)?: Total Help needed to walk in hospital room?: Total Help needed climbing 3-5 steps with a railing? : Total 6 Click Score: 6    End of Session Equipment Utilized During Treatment: Oxygen;Gait belt Activity Tolerance: Patient limited by fatigue;Patient limited by lethargy Patient left: in bed;with bed alarm set;with call bell/phone within reach Nurse Communication: Mobility status PT Visit Diagnosis: Difficulty in walking, not elsewhere classified (R26.2);Muscle weakness (generalized) (M62.81);Other abnormalities of gait and mobility (R26.89)     Time: 5859-2924 PT Time Calculation (min) (ACUTE ONLY): 20 min  Charges:  $Therapeutic Exercise: 8-22 mins                     1:31 PM, 04/16/19 Etta Grandchild, PT, DPT Physical Therapist - [facility]  [phone] ([place])   [place] 04/16/2019, 1:26 PM

## 2019-04-16 NOTE — TOC Initial Note (Addendum)
Transition of Care [facility]) - Initial/Assessment Note    Patient Details  Name: Valerie Trujillo MRN: [id number] Date of Birth: [birth year]  [facility]) CM/SW Contact:    Maree Krabbe, LCSW Phone Number: 04/16/2019, 10:11 AM  Clinical Narrative:    Pt is alert and oriented. CSW spoke with pt at bedside. Pt states she lives at home with her retired Niece. Pt states she is agreeable to [facility] and hopes that she is able to work with them. Per PT pt was very weak. CSW will continue to follow to determine level of care needed. Pt does have 24/7 supervision (Niece). Pt may need a transport chair if continues to present weak. CSW will continue to follow. Pt did not have a preference for [facility] agency and when present options pt was agreeable to [facility]. Referral given to [facility] with [facility]   1.  Do you have a scale? Yes 2.  Do you weigh yourself daily? No, but aware need to 3.  Two pounds over night or 5 in one week call your doctor because it could be fluid overload? Informed 4.  Do you go to the Heart Failure Clinic? Yes 5. An appointment with heart failure clinic will be made before discharge if. Scheduled for 3/24 6. If patient is home bound, home health can be arranged with an agency that provides a heart failure protocol.            HH will be arranged   Expected Discharge Plan: Home w Home Health Services Barriers to Discharge: Continued Medical Work up   Patient Goals and CMS Choice Patient states their goals for this hospitalization and ongoing recovery are:: to get better   Choice offered to / list presented to : Patient  Expected Discharge Plan and Services Expected Discharge Plan: Home w Home Health Services In-house Referral: Clinical Social Work   Post Acute Care Choice: Home Health Living arrangements for the past 2 months: Single Family Home Expected Discharge Date: 04/16/19                         HH Arranged: PT HH Agency: [facility] Date HH Agency Contacted: 04/16/19 Time HH Agency Contacted: 1010 Representative spoke with at [facility] Agency: Barbara Cower  Prior Living Arrangements/Services Living arrangements for the past 2 months: Single Family Home Lives with:: Other (Comment)(Neice) Patient language and need for interpreter reviewed:: Yes Do you feel safe going back to the place where you live?: Yes      Need for Family Participation in Patient Care: Yes (Comment) Care giver support system in place?: Yes (comment)   Criminal Activity/Legal Involvement Pertinent to Current Situation/Hospitalization: No - Comment as needed  Activities of Daily Living Home Assistive Devices/Equipment: Walker (specify type) ADL Screening (condition at time of admission) Patient's cognitive ability adequate to safely complete daily activities?: No Is the patient deaf or have difficulty hearing?: No Does the patient have difficulty seeing, even when wearing glasses/contacts?: No Does the patient have difficulty concentrating, remembering, or making decisions?: No Patient able to express need for assistance with ADLs?: Yes Does the patient have difficulty dressing or bathing?: Yes Independently performs ADLs?: No Communication: Independent Dressing (OT): Needs assistance Is this a change from baseline?: Pre-admission baseline Grooming: Needs assistance Is this a change from baseline?: Pre-admission baseline Feeding: Needs assistance Is this a change from baseline?: Pre-admission baseline Bathing: Needs assistance Is this a change from baseline?: Pre-admission baseline Toileting:  Independent In/Out Bed: Needs assistance Is this a change from baseline?: Pre-admission baseline Walks in Home: Independent Does the patient have difficulty walking or climbing stairs?: Yes Weakness of Legs: Left Weakness of Arms/Hands: None  Permission Sought/Granted Permission sought to share information with : Family Supports    Share  Information with NAME: Pamala Hurry  Permission granted to share info w AGENCY: [facility]  Permission granted to share info w Relationship: neice     Emotional Assessment Appearance:: Appears stated age Attitude/Demeanor/Rapport: Engaged Affect (typically observed): Accepting, Appropriate, Calm Orientation: : Oriented to Situation, Oriented to  Time, Oriented to Place, Oriented to Self Alcohol / Substance Use: Not Applicable Psych Involvement: No (comment)  Admission diagnosis:  Acute respiratory failure (HCC) [J96.00] Acute on chronic respiratory failure with hypoxia ([place]) [J96.21] Patient Active Problem List   Diagnosis Date Noted  . Acute respiratory failure ([place]) 04/12/2019  . Hypertensive emergency 01/14/2018  . Uncontrolled hypertension 01/14/2018   PCP:  Joseph Berkshire, MD Pharmacy:   [facility] [phone], [address] [place] [address] Phone: [phone] Fax: [phone]     Social Determinants of Health (SDOH) Interventions    Readmission Risk Interventions No flowsheet data found.

## 2019-04-17 ENCOUNTER — Encounter: Payer: Self-pay | Admitting: Family Medicine

## 2019-04-17 DIAGNOSIS — I5033 Acute on chronic diastolic (congestive) heart failure: Secondary | ICD-10-CM | POA: Diagnosis present

## 2019-04-17 DIAGNOSIS — J9601 Acute respiratory failure with hypoxia: Secondary | ICD-10-CM

## 2019-04-17 DIAGNOSIS — I350 Nonrheumatic aortic (valve) stenosis: Secondary | ICD-10-CM

## 2019-04-17 LAB — CBC WITH DIFFERENTIAL/PLATELET
Abs Immature Granulocytes: 0.04 10*3/uL (ref 0.00–0.07)
Basophils Absolute: 0 10*3/uL (ref 0.0–0.1)
Basophils Relative: 0 %
Eosinophils Absolute: 0.1 10*3/uL (ref 0.0–0.5)
Eosinophils Relative: 1 %
HCT: 32.6 % — ABNORMAL LOW (ref 36.0–46.0)
Hemoglobin: 10.5 g/dL — ABNORMAL LOW (ref 12.0–15.0)
Immature Granulocytes: 0 %
Lymphocytes Relative: 13 %
Lymphs Abs: 1.2 10*3/uL (ref 0.7–4.0)
MCH: 31.3 pg (ref 26.0–34.0)
MCHC: 32.2 g/dL (ref 30.0–36.0)
MCV: 97.3 fL (ref 80.0–100.0)
Monocytes Absolute: 1.2 10*3/uL — ABNORMAL HIGH (ref 0.1–1.0)
Monocytes Relative: 13 %
Neutro Abs: 6.9 10*3/uL (ref 1.7–7.7)
Neutrophils Relative %: 73 %
Platelets: 208 10*3/uL (ref 150–400)
RBC: 3.35 MIL/uL — ABNORMAL LOW (ref 3.87–5.11)
RDW: 13.9 % (ref 11.5–15.5)
WBC: 9.5 10*3/uL (ref 4.0–10.5)
nRBC: 0 % (ref 0.0–0.2)

## 2019-04-17 LAB — CULTURE, BLOOD (ROUTINE X 2)
Culture: NO GROWTH
Culture: NO GROWTH
Special Requests: ADEQUATE
Special Requests: ADEQUATE

## 2019-04-17 LAB — BASIC METABOLIC PANEL
Anion gap: 12 (ref 5–15)
BUN: 27 mg/dL — ABNORMAL HIGH (ref 8–23)
CO2: 29 mmol/L (ref 22–32)
Calcium: 8.6 mg/dL — ABNORMAL LOW (ref 8.9–10.3)
Chloride: 98 mmol/L (ref 98–111)
Creatinine, Ser: 1.58 mg/dL — ABNORMAL HIGH (ref 0.44–1.00)
GFR calc Af Amer: 36 mL/min — ABNORMAL LOW (ref >60)
GFR calc non Af Amer: 31 mL/min — ABNORMAL LOW (ref >60)
Glucose, Bld: 117 mg/dL — ABNORMAL HIGH (ref 70–99)
Potassium: 4 mmol/L (ref 3.5–5.1)
Sodium: 139 mmol/L (ref 135–145)

## 2019-04-17 NOTE — TOC Transition Note (Signed)
Transition of Care [facility]) - CM/SW Discharge Note   Patient Details  Name: Valerie Trujillo MRN: [id number] Date of Birth: February 07, 1940  [facility] CM/SW Contact:  Maree Krabbe, LCSW Phone Number: 04/17/2019, 12:31 PM   Clinical Narrative: Clinical Social Worker facilitated patient discharge including contacting patient family and facility to confirm patient discharge plans.  Clinical information faxed to facility and family agreeable with plan.  CSW arranged ambulance transport via [facility] to [facility] (room 809).  RN to call [phone] (ask for 800 hall RN) for report prior to discharge.      Final next level of care: Skilled Nursing Facility Barriers to Discharge: No Barriers Identified   Patient Goals and CMS Choice Patient states their goals for this hospitalization and ongoing recovery are:: to get better   Choice offered to / list presented to : Patient  Discharge Placement              Patient chooses bed at: [facility] [place] Patient to be transferred to facility by: [facility] Name of family member notified: Britta Mccreedy at bedside Patient and family notified of of transfer: 04/17/19  Discharge Plan and Services In-house Referral: Clinical Social Work   Post Acute Care Choice: Home Health                    HH Arranged: PT [facility] Agency: [facility] Date HH Agency Contacted: 04/16/19 Time HH Agency Contacted: 1010 Representative spoke with at [facility] Agency: Barbara Cower  Social Determinants of Health (SDOH) Interventions     Readmission Risk Interventions No flowsheet data found.

## 2019-04-17 NOTE — Discharge Summary (Addendum)
Physician Discharge Summary  Valerie Trujillo WGN:[id number] DOB: 10/29/1940 DOA: 04/12/2019  PCP: Woodroe Chen, MD  Admit date: 04/12/2019 Discharge date: 04/17/2019  Discharge disposition: Skilled nursing facility   Recommendations for Outpatient Follow-Up:    Please follow up with PCP in 1 week and Cardiology in 2 weeks   Discharge Diagnosis:   Principal Problem:   Acute on chronic diastolic CHF (congestive heart failure) (HCC) Active Problems:   Acute respiratory failure (HCC)   Aortic stenosis, severe    Discharge Condition: Stable.  Diet recommendation: low salt diet  Code status: Full code   Hospital Course:   History of present illness: As per the H and P dictated on admission, "AleneBrownis an 79 y.o.femalewith a known history ofcoronary artery disease status post three-vessel CABG, CVA and atrial fibrillation, on Eliquis, who presented to the emergency room with acute onset of worsening dyspnea over the last 4 days specifically over the last couple of days with associated orthopnea and worsening lower extremity edema. She admits to nausea without vomiting this morning. No fever or chills. She denies any cough or wheezing. No chest pain or palpitations.  When she came to the ER, she was afebrile and heart rate was 111 with a blood pressure 133/91 pulse 70 was 6 6 to 79% on room air and it came up to 98-high percent on BiPAP. She was tachypneic to 24-29 and blood pressure later was 157/103. Labs revealed mild hypokalemia of 3.2 BUN and creatinine of 22/1.2. BNP was 877 and high-sensitivity troponin I was 460 and later 2523. Lactic acid was 1.2. Ferritin was 157 and CRP 2.2. Procalcitonin was less than 0.1. CBC showed mild anemia. Influenza antigens and COVID-19 PCR came back negative. Urinalysis showed more than 50 RBCs and rare bacteria with 0-5 WBCs. Portable chest ray showed moderate bilateral pleural effusions with adjacent atelectasis and  probably mild pulmonary edema and mild cardiomegaly. EKG showed normal sinus rhythm with a rate of 97 with left bundle branch block. The patient had an EKG at his cardiologist office on 12/13/2018 showed normal sinus rhythm then without documented left bundle branch block.  The patient was given 80 mg of IV Lasix. The patient was initially placed on BiPAP and that was tapered down to nasal cannula and she did not tolerated she was placed on high flow nasal cannula at 10 L/min with adequate pulse oximetry. Consult was made to Dr. Gwen Pounds who is aware about the patient. He recommended IV heparin while holding off Plavix and Eliquis. The patient will be admitted to stepdown unit for further evaluation and management."  Hospital Course:  Summary of her active problems in the hospital is as following.   1. Acute non-STEMI. 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 Acute hypoxic respiratory failure. Aortic stenosis/severe. History of CAD SP CABG protection essential hypertension. Dyslipidemia. PVD Cardiology consulted. EKG shows evidence of LBBB. Initially on IV heparin. D-dimer elevated currently on therapeutic anticoagulation. Negative Doppler for DVT Continue Lipitor. Continue Coreg. Echocardiogramshows preserved EF. Underwent left heart catheterization which showed patent vessel with severe aortic stenosis. Cardiology recommending outpatient follow-up for further discussion of aortic valve replacement procedures.  Currently Norvasc is on hold, clonidine reduced from 0.2 mg twice daily 2.1 mg twice daily. Currently on lisinopril 40 mg daily. Metoprolol 100 mg daily transition to Coreg 25 mg twice daily.  2. Anxiety Improved  3. hematuria Overactive bladder Initially hadsome mild pink-tinged urine. Resume Myrbetriq Outpatient follow-up with urology.   4. Hypokalemia Corrected  5. Acute  kidney injury Likely cardiorenal hemodynamics. Improved. Monitor renal  function closely.  6. Chronic anticoagulation On Eliquis 2.5 mg BID at home, continue   7. Constipation  Continue miralax  Body mass index is 34.61 kg/m.   Patient was seen by physical therapy, who recommended SNF, which was arranged. On the day of the discharge the patient's vitals were stable, and no other acute medical condition were reported by patient. the patient was felt safe to be discharge at SNF with therapy.  Consultants: Cardiology  Procedures: Cardiac Catheterization  Echocardiogram      Discharge Exam:   Vitals:   04/17/19 0516 04/17/19 0742  BP: 106/73 115/78  Pulse: 99 81  Resp: 20 20  Temp: 98.4 F (36.9 C) 98.6 F (37 C)  SpO2: 99% 98%   Vitals:   04/16/19 1941 04/16/19 2150 04/17/19 0516 04/17/19 0742  BP: (!) 86/62 117/60 106/73 115/78  Pulse: 90 87 99 81  Resp:   20 20  Temp:   98.4 F (36.9 C) 98.6 F (37 C)  TempSrc:   Oral Oral  SpO2: 93%  99% 98%  Weight:   83.1 kg   Height:         GEN: NAD SKIN: No rash EYES: EOMI ENT: MMM CV: RRR PULM: CTA B ABD: soft, obese, NT, +BS CNS: AAO x 3, non focal EXT: No edema or tenderness   The results of significant diagnostics from this hospitalization (including imaging, microbiology, ancillary and laboratory) are listed below for reference.     Procedures and Diagnostic Studies:   US Venous Img Lower Bilateral (DVT)  Result Date: 04/13/2019 CLINICAL DATA:  Lower extremity edema for years. Patient is on Eliquis. EXAM: BILATERAL LOWER EXTREMITY VENOUS DOPPLER ULTRASOUND TECHNIQUE: Gray-scale sonography with compression, as well as color and duplex ultrasound, were performed to evaluate the deep venous system(s) from the level of the common femoral vein through the popliteal and proximal calf veins. COMPARISON:  None. FINDINGS: VENOUS Normal compressibility of the common femoral, superficial femoral, and popliteal veins, as well as the visualized calf veins. Visualized portions of  profunda femoral vein and great saphenous vein unremarkable. No filling defects to suggest DVT on grayscale or color Doppler imaging. Doppler waveforms show normal direction of venous flow, normal respiratory phasicity and response to augmentation. Limited views of the contralateral common femoral vein are unremarkable. OTHER None. Limitations: none IMPRESSION: No femoropopliteal DVT nor evidence of DVT within the visualized calf veins. If clinical symptoms are inconsistent or if there are persistent or worsening symptoms, further imaging (possibly involving the iliac veins) may be warranted. Electronically Signed   By: Amie Portland M.D.   On: 04/13/2019 16:01   DG Chest Port 1 View  Result Date: 04/13/2019 CLINICAL DATA:  SOB per physician order. Per physician notes - Patient presented with complaints of cough and shortness of breath. Found to have non-STEMI and acute hypoxic respiratory failure.Currently further plan is continue aggressive diuresis. Pt admitted yesterday for acute non-STEMI, acute on chronic possibly diastolic CHF, acute hypoxic respiratory failure. Hx - A-fib, stroke, CABG 1980, former smoker. EXAM: PORTABLE CHEST 1 VIEW COMPARISON:  04/12/2019 FINDINGS: Stable cardiomegaly. Moderate, left greater than right, pleural effusions. There are linear opacities in the left mid lung most likely atelectasis. No new lung abnormalities.  No pneumothorax. IMPRESSION: 1. Cardiomegaly with moderate bilateral pleural effusions, left greater than right, associated with atelectasis. Underlying pneumonia is not excluded. No convincing pulmonary edema. Electronically Signed   By: Renard Hamper.D.  On: 04/13/2019 16:00   ECHOCARDIOGRAM COMPLETE  Result Date: 04/14/2019    ECHOCARDIOGRAM REPORT   Patient Name:   Valerie CASTRONOVA Date of Exam: 04/13/2019 Medical Rec #:  [id number]   Height:       60.0 in Accession #:    [id number]  Weight:       178.6 lb Date of Birth:  27-Feb-1940   BSA:          1.779 m Patient  Age:    78 years    BP:           137/75 mmHg Patient Gender: F           HR:           84 bpm. Exam Location:  [facility] Procedure: 2D Echo Indications:     Acute Diastolic CHF  History:         Patient has prior history of Echocardiogram examinations. Prior                  CABG, Stroke; Arrythmias:Atrial Fibrillation.  Sonographer:     LTM Referring Phys:  [id number] Vernetta Honey MANSY Diagnosing Phys: Arnoldo Hooker MD IMPRESSIONS  1. Left ventricular ejection fraction, by estimation, is 60 to 65%. The left ventricle has normal function. The left ventricle has no regional wall motion abnormalities. There is moderate left ventricular hypertrophy. Left ventricular diastolic parameters are consistent with Grade I diastolic dysfunction (impaired relaxation).  2. Right ventricular systolic function is normal. The right ventricular size is normal. There is moderately elevated pulmonary artery systolic pressure.  3. Left atrial size was moderately dilated.  4. Right atrial size was moderately dilated.  5. The mitral valve is normal in structure. Moderate mitral valve regurgitation.  6. Tricuspid valve regurgitation is moderate.  7. The aortic valve is grossly normal. Aortic valve regurgitation is mild. Severe aortic valve stenosis. FINDINGS  Left Ventricle: Left ventricular ejection fraction, by estimation, is 60 to 65%. The left ventricle has normal function. The left ventricle has no regional wall motion abnormalities. The left ventricular internal cavity size was normal in size. There is  moderate left ventricular hypertrophy. Left ventricular diastolic parameters are consistent with Grade I diastolic dysfunction (impaired relaxation). Right Ventricle: The right ventricular size is normal. No increase in right ventricular wall thickness. Right ventricular systolic function is normal. There is moderately elevated pulmonary artery systolic pressure. The tricuspid regurgitant velocity is 2.81 m/s, and with an assumed right atrial  pressure of 10 mmHg, the estimated right ventricular systolic pressure is 41.6 mmHg. Left Atrium: Left atrial size was moderately dilated. Right Atrium: Right atrial size was moderately dilated. Pericardium: There is no evidence of pericardial effusion. Mitral Valve: The mitral valve is normal in structure. Moderate mitral valve regurgitation. MV peak gradient, 13.5 mmHg. The mean mitral valve gradient is 5.0 mmHg. Tricuspid Valve: The tricuspid valve is normal in structure. Tricuspid valve regurgitation is moderate. Aortic Valve: The aortic valve is grossly normal. Aortic valve regurgitation is mild. Aortic regurgitation PHT measures 477 msec. Severe aortic stenosis is present. Aortic valve mean gradient measures 53.5 mmHg. Aortic valve peak gradient measures 100.8 mmHg. Aortic valve area, by VTI measures 0.53 cm. Pulmonic Valve: The pulmonic valve was normal in structure. Pulmonic valve regurgitation is not visualized. Aorta: The aortic root and ascending aorta are structurally normal, with no evidence of dilitation. IAS/Shunts: No atrial level shunt detected by color flow Doppler.  LEFT VENTRICLE PLAX 2D LVIDd:  4.20 cm  Diastology LVIDs:         2.56 cm  LV e' lateral:   4.79 cm/s LV PW:         1.30 cm  LV E/e' lateral: 31.9 LV IVS:        1.42 cm  LV e' medial:    3.37 cm/s LVOT diam:     2.10 cm  LV E/e' medial:  45.4 LV SV:         53 LV SV Index:   30 LVOT Area:     3.46 cm  LEFT ATRIUM              Index       RIGHT ATRIUM           Index LA Vol (A2C):   115.0 ml 64.65 ml/m RA Area:     35.80 cm LA Vol (A4C):   96.8 ml  54.42 ml/m RA Volume:   138.00 ml 77.58 ml/m LA Biplane Vol: 112.0 ml 62.96 ml/m  AORTIC VALVE AV Area (Vmax):    0.56 cm AV Area (Vmean):   0.61 cm AV Area (VTI):     0.53 cm AV Vmax:           502.00 cm/s AV Vmean:          337.500 cm/s AV VTI:            1.000 m AV Peak Grad:      100.8 mmHg AV Mean Grad:      53.5 mmHg LVOT Vmax:         80.50 cm/s LVOT Vmean:         59.000 cm/s LVOT VTI:          0.152 m LVOT/AV VTI ratio: 0.15 AI PHT:            477 msec  AORTA Ao Root diam: 3.60 cm MITRAL VALVE                TRICUSPID VALVE MV Area (PHT): 2.87 cm     TR Peak grad:   31.6 mmHg MV Peak grad:  13.5 mmHg    TR Vmax:        281.00 cm/s MV Mean grad:  5.0 mmHg MV Vmax:       1.84 m/s     SHUNTS MV Vmean:      108.0 cm/s   Systemic VTI:  0.15 m MV Decel Time: 264 msec     Systemic Diam: 2.10 cm MV E velocity: 153.00 cm/s Arnoldo HookerBruce Kowalski MD Electronically signed by Arnoldo HookerBruce Kowalski MD Signature Date/Time: 04/14/2019/7:22:57 AM    Final      Labs:   Basic Metabolic Panel: Recent Labs  Lab 04/12/19 1727 04/12/19 2142 04/13/19 0541 04/13/19 0541 04/14/19 0504 04/14/19 0504 04/15/19 0503 04/15/19 0503 04/16/19 0327 04/17/19 0417  NA   < >  --  142  --  141  --  141  --  143 139  K   < >  --  3.6   < > 3.3*   < > 3.5   < > 3.7 4.0  CL   < >  --  106  --  101  --  98  --  99 98  CO2   < >  --  26  --  26  --  33*  --  30 29  GLUCOSE   < >  --  98  --  104*  --  116*  --  126* 117*  BUN   < >  --  22  --  18  --  19  --  20 27*  CREATININE   < >  --  0.95  --  0.86  --  0.97  --  1.17* 1.58*  CALCIUM   < >  --  8.8*  --  8.8*  --  9.1  --  8.9 8.6*  MG  --  1.8  --   --   --   --   --   --   --   --    < > = values in this interval not displayed.   GFR Estimated Creatinine Clearance: 27.6 mL/min (A) (by C-G formula based on SCr of 1.58 mg/dL (H)). Liver Function Tests: Recent Labs  Lab 04/12/19 1727  AST 26  ALT 14  ALKPHOS 62  BILITOT 1.6*  PROT 7.3  ALBUMIN 3.6   No results for input(s): LIPASE, AMYLASE in the last 168 hours. No results for input(s): AMMONIA in the last 168 hours. Coagulation profile Recent Labs  Lab 04/12/19 2142  INR 1.2    CBC: Recent Labs  Lab 04/13/19 0541 04/13/19 0541 04/14/19 0504 04/15/19 0503 04/16/19 0327 04/16/19 0959 04/17/19 0417  WBC 9.7   < > 8.5 8.8 8.8 8.8 9.5  NEUTROABS 7.4  --  6.1 6.3 6.5   --  6.9  HGB 12.3   < > 11.7* 11.7* 11.7* 11.8* 10.5*  HCT 37.3   < > 35.4* 35.0* 34.4* 36.3 32.6*  MCV 95.6   < > 95.7 96.4 95.6 98.6 97.3  PLT 188   < > 208 212 212 202 208   < > = values in this interval not displayed.   Cardiac Enzymes: No results for input(s): CKTOTAL, CKMB, CKMBINDEX, TROPONINI in the last 168 hours. BNP: Invalid input(s): POCBNP CBG: Recent Labs  Lab 04/12/19 2127 04/15/19 1445  GLUCAP 113* 142*   D-Dimer No results for input(s): DDIMER in the last 72 hours. Hgb A1c No results for input(s): HGBA1C in the last 72 hours. Lipid Profile No results for input(s): CHOL, HDL, LDLCALC, TRIG, CHOLHDL, LDLDIRECT in the last 72 hours. Thyroid function studies No results for input(s): TSH, T4TOTAL, T3FREE, THYROIDAB in the last 72 hours.  Invalid input(s): FREET3 Anemia work up No results for input(s): VITAMINB12, FOLATE, FERRITIN, TIBC, IRON, RETICCTPCT in the last 72 hours. Microbiology Recent Results (from the past 240 hour(s))  Culture, blood (routine x 2)     Status: None   Collection Time: 04/12/19  5:33 PM   Specimen: BLOOD  Result Value Ref Range Status   Specimen Description BLOOD BLOOD LEFT ARM  Final   Special Requests   Final    BOTTLES DRAWN AEROBIC AND ANAEROBIC Blood Culture adequate volume   Culture   Final    NO GROWTH 5 DAYS Performed at [facility], [address], [place], [place] [address]    Report Status 04/17/2019 FINAL  Final  Culture, blood (routine x 2)     Status: None   Collection Time: 04/12/19  7:42 PM   Specimen: BLOOD  Result Value Ref Range Status   Specimen Description BLOOD LEFT HAND  Final   Special Requests   Final    BOTTLES DRAWN AEROBIC AND ANAEROBIC Blood Culture adequate volume   Culture   Final    NO GROWTH 5 DAYS Performed at [facility], [address] [place], [place]  [address]    Report Status 04/17/2019 FINAL  Final  Respiratory Panel by RT PCR (Flu A&B, Covid) - Nasopharyngeal  Swab     Status: None   Collection Time: 04/12/19  7:54 PM   Specimen: Nasopharyngeal Swab  Result Value Ref Range Status   SARS Coronavirus 2 by RT PCR NEGATIVE NEGATIVE Final    Comment: (NOTE) SARS-CoV-2 target nucleic acids are NOT DETECTED. The SARS-CoV-2 RNA is generally detectable in upper respiratoy specimens during the acute phase of infection. The lowest concentration of SARS-CoV-2 viral copies this assay can detect is 131 copies/mL. A negative result does not preclude SARS-Cov-2 infection and should not be used as the sole basis for treatment or other patient management decisions. A negative result may occur with  improper specimen collection/handling, submission of specimen other than nasopharyngeal swab, presence of viral mutation(s) within the areas targeted by this assay, and inadequate number of viral copies (<131 copies/mL). A negative result must be combined with clinical observations, patient history, and epidemiological information. The expected result is Negative. Fact Sheet for Patients:  PinkCheek.be Fact Sheet for Healthcare Providers:  GravelBags.it This test is not yet ap proved or cleared by the [place] FDA and  has been authorized for detection and/or diagnosis of SARS-CoV-2 by FDA under an Emergency Use Authorization (EUA). This EUA will remain  in effect (meaning this test can be used) for the duration of the COVID-19 declaration under Section 564(b)(1) of the Act, 21 U.S.C. section 360bbb-3(b)(1), unless the authorization is terminated or revoked sooner.    Influenza A by PCR NEGATIVE NEGATIVE Final   Influenza B by PCR NEGATIVE NEGATIVE Final    Comment: (NOTE) The Xpert Xpress SARS-CoV-2/FLU/RSV assay is intended as an aid in  the diagnosis of influenza from Nasopharyngeal swab specimens and  should not be used as a sole basis for treatment. Nasal washings and  aspirates are  unacceptable for Xpert Xpress SARS-CoV-2/FLU/RSV  testing. Fact Sheet for Patients: PinkCheek.be Fact Sheet for Healthcare Providers: GravelBags.it This test is not yet approved or cleared by the [place] FDA and  has been authorized for detection and/or diagnosis of SARS-CoV-2 by  FDA under an Emergency Use Authorization (EUA). This EUA will remain  in effect (meaning this test can be used) for the duration of the  Covid-19 declaration under Section 564(b)(1) of the Act, 21  U.S.C. section 360bbb-3(b)(1), unless the authorization is  terminated or revoked. Performed at [facility], [address] [place], [place] [address]   MRSA PCR Screening     Status: None   Collection Time: 04/12/19 10:07 PM   Specimen: Nasopharyngeal  Result Value Ref Range Status   MRSA by PCR NEGATIVE NEGATIVE Final    Comment:        The GeneXpert MRSA Assay (FDA approved for NASAL specimens only), is one component of a comprehensive MRSA colonization surveillance program. It is not intended to diagnose MRSA infection nor to guide or monitor treatment for MRSA infections. Performed at [facility], [address] [place], [place] [address]   Respiratory Panel by RT PCR (Flu A&B, Covid) - Nasopharyngeal Swab     Status: None   Collection Time: 04/16/19  2:35 PM   Specimen: Nasopharyngeal Swab  Result Value Ref Range Status   SARS Coronavirus 2 by RT PCR NEGATIVE NEGATIVE Final    Comment: (NOTE) SARS-CoV-2 target nucleic acids are NOT DETECTED. The SARS-CoV-2 RNA is generally detectable in upper respiratoy specimens during the acute  phase of infection. The lowest concentration of SARS-CoV-2 viral copies this assay can detect is 131 copies/mL. A negative result does not preclude SARS-Cov-2 infection and should not be used as the sole basis for treatment or other patient management decisions. A negative result may  occur with  improper specimen collection/handling, submission of specimen other than nasopharyngeal swab, presence of viral mutation(s) within the areas targeted by this assay, and inadequate number of viral copies (<131 copies/mL). A negative result must be combined with clinical observations, patient history, and epidemiological information. The expected result is Negative. Fact Sheet for Patients:  https://www.moore.com/ Fact Sheet for Healthcare Providers:  https://www.young.biz/ This test is not yet ap proved or cleared by the [place] FDA and  has been authorized for detection and/or diagnosis of SARS-CoV-2 by FDA under an Emergency Use Authorization (EUA). This EUA will remain  in effect (meaning this test can be used) for the duration of the COVID-19 declaration under Section 564(b)(1) of the Act, 21 U.S.C. section 360bbb-3(b)(1), unless the authorization is terminated or revoked sooner.    Influenza A by PCR NEGATIVE NEGATIVE Final   Influenza B by PCR NEGATIVE NEGATIVE Final    Comment: (NOTE) The Xpert Xpress SARS-CoV-2/FLU/RSV assay is intended as an aid in  the diagnosis of influenza from Nasopharyngeal swab specimens and  should not be used as a sole basis for treatment. Nasal washings and  aspirates are unacceptable for Xpert Xpress SARS-CoV-2/FLU/RSV  testing. Fact Sheet for Patients: https://www.moore.com/ Fact Sheet for Healthcare Providers: https://www.young.biz/ This test is not yet approved or cleared by the [place] FDA and  has been authorized for detection and/or diagnosis of SARS-CoV-2 by  FDA under an Emergency Use Authorization (EUA). This EUA will remain  in effect (meaning this test can be used) for the duration of the  Covid-19 declaration under Section 564(b)(1) of the Act, 21  U.S.C. section 360bbb-3(b)(1), unless the authorization is  terminated or  revoked. Performed at [facility], [address], [place], [place] [address]      Discharge Instructions:   Discharge Instructions    AMB Referral to Cardiac Rehabilitation - Phase II   Complete by: As directed    Diagnosis: Heart Failure (see criteria below if ordering Phase II)   Heart Failure Type: Chronic Diastolic   Diet - low sodium heart healthy   Complete by: As directed    Discharge instructions   Complete by: As directed    It is important that you read the given instructions as well as go over your medication list with RN to help you understand your care after this hospitalization.  Please follow-up with PCP in 1-2 weeks.  Please note that NO REFILLS for any discharge medications will be authorized once you are discharged, as it is imperative that you return to your primary care physician (or establish a relationship with a primary care physician if you do not have one) for your aftercare needs so that they can reassess your need for medications and monitor your lab values.  Please request your primary care physician to go over all Hospital Tests and Procedure/Radiological results at the follow up. Please get all Hospital records sent to your PCP by signing hospital release before you go home.   Do not drive, operating heavy machinery, perform activities at heights, swimming or participation in water activities or provide baby sitting services; until you have been seen by Primary Care Physician or a Neurologist and are cleared to do such activities.  Do not take more than prescribed Pain, Sleep and Anxiety Medications.  You were cared for by a hospitalist during your hospital stay. If you have any questions about your discharge medications or the care you received while you were in the hospital after you are discharged, you can call the unit @UNIT @ you were admitted to and ask to speak with the hospitalist . Ask for Hospitalist on call if the  hospitalist that took care of you is not available.   Once you are discharged, your primary care physician will handle any further medical issues.  You Must read complete instructions/literature along with all the possible adverse reactions/side effects for all the Medicines you take and that have been prescribed to you. Take any new Medicines after you have completely understood and accept all the possible adverse reactions/side effects.  If you have smoked or chewed Tobacco in the last 2 yrs please STOP smoking STOP any Recreational drug use.  If you drink alcohol, please safely reduce the use. Do not drive, operating heavy machinery, perform activities at heights, swimming or participation in water activities or provide baby sitting services under influence.  Wear Seat belts while driving.   Increase activity slowly   Complete by: As directed      Allergies as of 04/17/2019      Reactions   Aspirin Palpitations   Per Pt: Makes heart flutter.      Medication List    STOP taking these medications   amLODipine 10 MG tablet Commonly known as: NORVASC   cloNIDine 0.2 mg/24hr patch Commonly known as: CATAPRES - Dosed in mg/24 hr   lisinopril 40 MG tablet Commonly known as: ZESTRIL   metoprolol succinate 100 MG 24 hr tablet Commonly known as: TOPROL-XL     TAKE these medications   acetaminophen 325 MG tablet Commonly known as: TYLENOL Take 2 tablets (650 mg total) by mouth every 6 (six) hours as needed for mild pain (or Fever >/= 101).   atorvastatin 40 MG tablet Commonly known as: Lipitor Take 1 tablet (40 mg total) by mouth daily. What changed: Another medication with the same name was removed. Continue taking this medication, and follow the directions you see here.   carvedilol 25 MG tablet Commonly known as: COREG Take 1 tablet (25 mg total) by mouth 2 (two) times daily with a meal.   Coenzyme Q10 100 MG capsule Take 1 capsule by mouth daily.   Eliquis 2.5 MG Tabs  tablet Generic drug: apixaban Take 2.5 mg by mouth 2 (two) times daily.   furosemide 40 MG tablet Commonly known as: LASIX Take 1 tablet (40 mg total) by mouth 2 (two) times daily.   mirtazapine 15 MG tablet Commonly known as: REMERON Take 15 mg by mouth at bedtime.   Myrbetriq 50 MG Tb24 tablet Generic drug: mirabegron ER Take 50 mg by mouth daily.   Plavix 75 MG tablet Generic drug: clopidogrel Take 75 mg by mouth daily.   polyethylene glycol 17 g packet Commonly known as: MIRALAX / GLYCOLAX Take 17 g by mouth daily.   potassium chloride SA 20 MEQ tablet Commonly known as: KLOR-CON Take 1 tablet (20 mEq total) by mouth daily.   senna-docusate 8.6-50 MG tablet Commonly known as: Senokot-S Take 1 tablet by mouth at bedtime as needed for mild constipation.   vitamin B-12 1000 MCG tablet Commonly known as: CYANOCOBALAMIN Take 1,000 mcg by mouth daily.            Durable  Medical Equipment  (From admission, onward)         Start     Ordered   04/16/19 1037  For home use only DME lightweight manual wheelchair with seat cushion  Once    Comments: Patient suffers from CHF which impairs their ability to perform daily activities like bathing, dressing, feeding, grooming and toileting in the home.  A cane, crutch or walker will not resolve  issue with performing activities of daily living. A wheelchair will allow patient to safely perform daily activities. Patient is not able to propel themselves in the home using a standard weight wheelchair due to endurance and general weakness. Patient can self propel in the lightweight wheelchair. Length of need Lifetime. Accessories: elevating leg rests (ELRs), wheel locks, extensions and anti-tippers.   04/16/19 1037   04/16/19 0838  For home use only DME oxygen  Once    Question Answer Comment  Length of Need Lifetime   Mode or (Route) Nasal cannula   Liters per Minute 4   Frequency Continuous (stationary and portable oxygen unit  needed)   Oxygen delivery system Gas      04/16/19 0838          Contact information for follow-up providers    [facility] Follow up on 04/24/2019.   Specialty: Cardiology Why: at 12:00pm. Enter through the Medical Mall entrance Contact information: [address] [place] [address] [phone]       Woodroe Chen, MD. Go on 04/25/2019.   Specialty: Geriatric Medicine Why: Appointment at 9:45am Contact information: [address] [facility] [place] [address] [phone]        Lamar Blinks, MD. Schedule an appointment as soon as possible for a visit in 2 week(s).   Specialty: Cardiology Contact information: [address] [facility] [place] [address] [phone]            Contact information for after-discharge care    Destination    [facility] Preferred SNF .   Service: Skilled Nursing Contact information: [address] [place] [address] [phone]                   Time coordinating discharge: 28 minutes  Signed:  Lurene Shadow  [facility] 04/17/2019, 11:11 AM

## 2019-04-17 NOTE — Plan of Care (Signed)

## 2019-04-17 NOTE — Progress Notes (Signed)
PT Cancellation Note  Patient Details Name: Valerie Trujillo MRN: [id number] DOB: 1940/10/01  Author arrived to room for treatment to find patient on phone with niece (primary caregiver). Pt makes known niece has strong desire to speak with pt's nurse but then asks author to call her. Author immediately calls [facility], pt reports discontent, told pt would DC last night, hence she went to [facility] awaiting pt arrival which did not take place. Ms. Leanne Chang wanting to know why pt did not DC and no one has told her. Author explained pt's DC was held due to hypotension issues and mentation changes. Niece asks for RN to contact her with detail on pt's discharge this date. Author made this known to Consulting civil engineer after unsuccessful attempts to get in touch with pt's RN x3. EMS in hall shortly thereafter for transport of patient, hence treatment is canceled at this time to allow for DC.    2:29 PM, 04/17/19 Rosamaria Lints, PT, DPT Physical Therapist - [facility]  [phone] (ASCOM)    Anatalia Kronk C 04/17/2019, 2:25 PM

## 2019-04-17 NOTE — Progress Notes (Signed)
Patient is discharge to [facility] and report given to floor nurse , left by EMS in a stable condition.

## 2019-04-19 ENCOUNTER — Telehealth: Payer: Self-pay | Admitting: Family

## 2019-04-19 NOTE — Telephone Encounter (Signed)
Called [facility] to confirm new patient appointment for 3/24. Was told patient is doing well with no complaints or issues. She is taking her meds as prescribed, daily weights, and following a low sodium diet.     Deetta Perla, [place]

## 2019-04-24 ENCOUNTER — Other Ambulatory Visit: Payer: Self-pay

## 2019-04-24 ENCOUNTER — Ambulatory Visit: Payer: No Typology Code available for payment source | Attending: Family | Admitting: Family

## 2019-04-24 ENCOUNTER — Encounter: Payer: Self-pay | Admitting: Family

## 2019-04-24 VITALS — BP 127/85 | HR 102 | Resp 16 | Ht 65.0 in | Wt 169.4 lb

## 2019-04-24 DIAGNOSIS — I5032 Chronic diastolic (congestive) heart failure: Secondary | ICD-10-CM | POA: Insufficient documentation

## 2019-04-24 DIAGNOSIS — Z951 Presence of aortocoronary bypass graft: Secondary | ICD-10-CM | POA: Diagnosis not present

## 2019-04-24 DIAGNOSIS — I350 Nonrheumatic aortic (valve) stenosis: Secondary | ICD-10-CM | POA: Diagnosis not present

## 2019-04-24 DIAGNOSIS — Z87891 Personal history of nicotine dependence: Secondary | ICD-10-CM | POA: Diagnosis not present

## 2019-04-24 DIAGNOSIS — Z8673 Personal history of transient ischemic attack (TIA), and cerebral infarction without residual deficits: Secondary | ICD-10-CM | POA: Insufficient documentation

## 2019-04-24 DIAGNOSIS — Z8249 Family history of ischemic heart disease and other diseases of the circulatory system: Secondary | ICD-10-CM | POA: Diagnosis not present

## 2019-04-24 DIAGNOSIS — I10 Essential (primary) hypertension: Secondary | ICD-10-CM

## 2019-04-24 DIAGNOSIS — Z886 Allergy status to analgesic agent status: Secondary | ICD-10-CM | POA: Insufficient documentation

## 2019-04-24 DIAGNOSIS — I252 Old myocardial infarction: Secondary | ICD-10-CM | POA: Insufficient documentation

## 2019-04-24 DIAGNOSIS — I2510 Atherosclerotic heart disease of native coronary artery without angina pectoris: Secondary | ICD-10-CM | POA: Diagnosis not present

## 2019-04-24 DIAGNOSIS — Z7901 Long term (current) use of anticoagulants: Secondary | ICD-10-CM | POA: Insufficient documentation

## 2019-04-24 DIAGNOSIS — Z79899 Other long term (current) drug therapy: Secondary | ICD-10-CM | POA: Insufficient documentation

## 2019-04-24 DIAGNOSIS — I110 Hypertensive heart disease with heart failure: Secondary | ICD-10-CM | POA: Insufficient documentation

## 2019-04-24 NOTE — Progress Notes (Signed)
Patient ID: Valerie Trujillo, female    DOB: April 13, 1940, 79 y.o.   MRN: [id number]  HPI  Valerie Trujillo is a 79 y/o female with a history of CAD, HTN, stroke, atrial fibrillation, aortic stenosis, previous tobacco use and chronic heart failure.   Echo report from 04/13/19 reviewed and showed an EF of 60-65% along with moderate LVH, moderately elevated PA pressure, moderate MR/TR, mild AR and severe AS.   RHC/LHC done 04/15/19 and showed: normal LV systolic function with increased left ventricular end-diastolic pressures consistent with diastolic dysfunction with ejection fraction of 65% PA pressures of 49/24 with a mean of 35 mm consistent with mild pulmonary hypertension Pulmonary capillary wedge pressure mean of 19 Aortic pressure of 110/63 with left ventricular pressures of 161/18 having a peak gradient of 50 mm Aortic valve area 0.8 cm Echocardiogram showing normal LV systolic function with a peak gradient of 77 mm mean gradient of 42 mm and an aortic velocity of 4.4 m/s LIMA to left anterior descending artery patent SVG to obtuse marginal 1 patent SVG to PDA occluded with collateral blood flow from distal LAD to PDA Mid right coronary artery, proximal left circumflex artery, mid left anterior descending artery occluded  Admitted 04/12/19 due to NSTEMI and acute on chronic HF. Cardiology consult obtained. Doppler was negative for DVT. Initially needed IV lasix and then transitioned to oral diuretics. Discharged after 5 days.   She presents today for her initial visit with a chief complaint of intermittent light-headedness upon sudden position changes. She describes this as being present for several months. She has associated rhinorrhea along with this. She denies any difficulty sleeping, abdominal distention, palpitations, pedal edema, chest pain, shortness of breath, cough or fatigue.   Past Medical History:  Diagnosis Date  . A-fib (HCC)   . Aortic stenosis   . CHF (congestive heart failure)  (HCC)   . Coronary artery disease   . Hypertension   . Stroke [facility])    Past Surgical History:  Procedure Laterality Date  . CORONARY ARTERY BYPASS GRAFT  1980  . LEFT HEART CATH AND CORS/GRAFTS ANGIOGRAPHY N/A 04/15/2019   Procedure: LEFT HEART CATH AND CORS/GRAFTS ANGIOGRAPHY;  Surgeon: Lamar Blinks, MD;  Location: [facility] INVASIVE CV LAB;  Service: Cardiovascular;  Laterality: N/A;  . RIGHT HEART CATH N/A 04/15/2019   Procedure: RIGHT HEART CATH;  Surgeon: Lamar Blinks, MD;  Location: [facility] INVASIVE CV LAB;  Service: Cardiovascular;  Laterality: N/A;   Family History  Problem Relation Age of Onset  . Heart disease Mother   . Diabetes Mellitus II Father   . Heart disease Father    Social History   Tobacco Use  . Smoking status: Former Games developer  . Smokeless tobacco: Never Used  Substance Use Topics  . Alcohol use: Never   Allergies  Allergen Reactions  . Aspirin Palpitations    Per Pt: Makes heart flutter.   Prior to Admission medications   Medication Sig Start Date End Date Taking? Authorizing Provider  acetaminophen (TYLENOL) 325 MG tablet Take 2 tablets (650 mg total) by mouth every 6 (six) hours as needed for mild pain (or Fever >/= 101). 01/17/18  Yes Gouru, Deanna Artis, MD  apixaban (ELIQUIS) 2.5 MG TABS tablet Take 2.5 mg by mouth 2 (two) times daily.    Yes [provider]  atorvastatin (LIPITOR) 40 MG tablet Take 1 tablet (40 mg total) by mouth daily. 04/16/19 04/15/20 Yes Rolly Salter, MD  carvedilol (COREG) 25 MG tablet Take  1 tablet (25 mg total) by mouth 2 (two) times daily with a meal. 04/16/19  Yes Rolly Salter, MD  clopidogrel (PLAVIX) 75 MG tablet Take 75 mg by mouth daily.    Yes [provider]  Coenzyme Q10 100 MG capsule Take 1 capsule by mouth daily.   Yes [provider]  furosemide (LASIX) 40 MG tablet Take 1 tablet (40 mg total) by mouth 2 (two) times daily. 04/16/19  Yes Rolly Salter, MD  mirtazapine (REMERON) 15 MG tablet  Take 15 mg by mouth at bedtime.    Yes [provider]  MYRBETRIQ 50 MG TB24 tablet Take 50 mg by mouth daily. 03/14/19  Yes [provider]  polyethylene glycol (MIRALAX / GLYCOLAX) 17 g packet Take 17 g by mouth daily. 04/16/19  Yes Rolly Salter, MD  potassium chloride SA (KLOR-CON) 20 MEQ tablet Take 1 tablet (20 mEq total) by mouth daily. 04/16/19  Yes Rolly Salter, MD  senna-docusate (SENOKOT-S) 8.6-50 MG tablet Take 1 tablet by mouth at bedtime as needed for mild constipation. 01/17/18  Yes Gouru, Deanna Artis, MD  vitamin B-12 (CYANOCOBALAMIN) 1000 MCG tablet Take 1,000 mcg by mouth daily.   Yes [provider]    Review of Systems  Constitutional: Negative for appetite change and fatigue.  HENT: Positive for rhinorrhea. Negative for congestion and sore throat.   Eyes: Negative.   Respiratory: Negative for cough and shortness of breath.   Cardiovascular: Negative for chest pain, palpitations and leg swelling.  Gastrointestinal: Negative for abdominal distention and abdominal pain.  Endocrine: Negative.   Genitourinary: Negative.   Musculoskeletal: Negative for back pain and neck pain.  Skin: Negative.   Allergic/Immunologic: Negative.   Neurological: Positive for light-headedness (with sudden changes). Negative for dizziness.  Hematological: Negative for adenopathy. Does not bruise/bleed easily.  Psychiatric/Behavioral: Negative for dysphoric mood and sleep disturbance (sleeping on 1 pillow). The patient is not nervous/anxious.    Vitals:   04/24/19 1127  BP: 127/85  Pulse: (!) 102  Resp: 16  SpO2: 99%  Weight: 169 lb 6 oz (76.8 kg)  Height: 5\' 5"  (1.651 m)  PF: (!) 2 L/min   Wt Readings from Last 3 Encounters:  04/24/19 169 lb 6 oz (76.8 kg)  04/17/19 183 lb 3.2 oz (83.1 kg)  01/17/18 177 lb 7.5 oz (80.5 kg)   Lab Results  Component Value Date   CREATININE 1.58 (H) 04/17/2019   CREATININE 1.17 (H) 04/16/2019   CREATININE 0.97 04/15/2019      Physical Exam Vitals and nursing note reviewed.  Constitutional:      Appearance: Normal appearance.  HENT:     Head: Normocephalic and atraumatic.  Cardiovascular:     Rate and Rhythm: Tachycardia present. Rhythm irregular.  Pulmonary:     Effort: Pulmonary effort is normal. No respiratory distress.     Breath sounds: No wheezing or rales.  Abdominal:     General: There is no distension.     Palpations: Abdomen is soft.  Musculoskeletal:        General: No tenderness.     Cervical back: Normal range of motion and neck supple.     Right lower leg: No edema.     Left lower leg: Edema (trace pitting left lower leg) present.  Skin:    General: Skin is warm and dry.  Neurological:     General: No focal deficit present.     Mental Status: She is alert and oriented to  person, place, and time.  Psychiatric:        Mood and Affect: Mood normal.        Behavior: Behavior normal.    Assessment & Plan:  1: Chronic heart failure with preserved ejection fraction- - NYHA class I - euvolemic today - says that she's not being weighed daily at [facility]; order written for her to be weighed daily and to call for an overnight weight gain of >2 pounds or a weekly weight gain of >5 pounds - does not add salt and when she's home, she tries to closely watch her sodium intake; niece that is present with her says that she doesn't cook with salt either - currently receiving PT at the facility - saw cardiology Minette Brine) 04/23/19 - BNP 04/13/19 was 573.0  2: HTN- - BP looks good today - saw PCP ([place]) 03/13/19 - BMP 04/17/19 reviewed and showed sodium 139, potassium 4.0, creatinine 1.58 and GFR 36  3: Aortic stenosis- - discussion about possible TAVR in the future   Facility medication list was reviewed.   Return in 3 months or sooner for any questions/problems before then.

## 2019-04-24 NOTE — Patient Instructions (Signed)
Continue weighing daily and call for an overnight weight gain of > 2 pounds or a weekly weight gain of >5 pounds. 

## 2019-07-16 ENCOUNTER — Ambulatory Visit: Payer: Medicare Other | Admitting: Family

## 2019-10-15 NOTE — Progress Notes (Deleted)
Patient ID: Valerie Trujillo, female    DOB: May 31, 1940, 79 y.o.   MRN: [id number]  HPI  Valerie Trujillo is a 79 y/o female with a history of CAD, HTN, stroke, atrial fibrillation, aortic stenosis, previous tobacco use and chronic heart failure.   Echo report from 04/13/19 reviewed and showed an EF of 60-65% along with moderate LVH, moderately elevated PA pressure, moderate MR/TR, mild AR and severe AS.   RHC/LHC done 04/15/19 and showed: normal LV systolic function with increased left ventricular end-diastolic pressures consistent with diastolic dysfunction with ejection fraction of 65% PA pressures of 49/24 with a mean of 35 mm consistent with mild pulmonary hypertension Pulmonary capillary wedge pressure mean of 19 Aortic pressure of 110/63 with left ventricular pressures of 161/18 having a peak gradient of 50 mm Aortic valve area 0.8 cm Echocardiogram showing normal LV systolic function with a peak gradient of 77 mm mean gradient of 42 mm and an aortic velocity of 4.4 m/s LIMA to left anterior descending artery patent SVG to obtuse marginal 1 patent SVG to PDA occluded with collateral blood flow from distal LAD to PDA Mid right coronary artery, proximal left circumflex artery, mid left anterior descending artery occluded  Admitted 04/12/19 due to NSTEMI and acute on chronic HF. Cardiology consult obtained. Doppler was negative for DVT. Initially needed IV lasix and then transitioned to oral diuretics. Discharged after 5 days.   She presents today for a follow-up visit with a chief complaint of  Past Medical History:  Diagnosis Date  . A-fib (HCC)   . Aortic stenosis   . CHF (congestive heart failure) (HCC)   . Coronary artery disease   . Hypertension   . Stroke [facility])    Past Surgical History:  Procedure Laterality Date  . CORONARY ARTERY BYPASS GRAFT  1980  . LEFT HEART CATH AND CORS/GRAFTS ANGIOGRAPHY N/A 04/15/2019   Procedure: LEFT HEART CATH AND CORS/GRAFTS ANGIOGRAPHY;  Surgeon:  Lamar Blinks, MD;  Location: [facility] INVASIVE CV LAB;  Service: Cardiovascular;  Laterality: N/A;  . RIGHT HEART CATH N/A 04/15/2019   Procedure: RIGHT HEART CATH;  Surgeon: Lamar Blinks, MD;  Location: [facility] INVASIVE CV LAB;  Service: Cardiovascular;  Laterality: N/A;   Family History  Problem Relation Age of Onset  . Heart disease Mother   . Diabetes Mellitus II Father   . Heart disease Father    Social History   Tobacco Use  . Smoking status: Former Games developer  . Smokeless tobacco: Never Used  Substance Use Topics  . Alcohol use: Never   Allergies  Allergen Reactions  . Aspirin Palpitations    Per Pt: Makes heart flutter.     Review of Systems  Constitutional: Negative for appetite change and fatigue.  HENT: Positive for rhinorrhea. Negative for congestion and sore throat.   Eyes: Negative.   Respiratory: Negative for cough and shortness of breath.   Cardiovascular: Negative for chest pain, palpitations and leg swelling.  Gastrointestinal: Negative for abdominal distention and abdominal pain.  Endocrine: Negative.   Genitourinary: Negative.   Musculoskeletal: Negative for back pain and neck pain.  Skin: Negative.   Allergic/Immunologic: Negative.   Neurological: Positive for light-headedness (with sudden changes). Negative for dizziness.  Hematological: Negative for adenopathy. Does not bruise/bleed easily.  Psychiatric/Behavioral: Negative for dysphoric mood and sleep disturbance (sleeping on 1 pillow). The patient is not nervous/anxious.       Physical Exam Vitals and nursing note reviewed.  Constitutional:  Appearance: Normal appearance.  HENT:     Head: Normocephalic and atraumatic.  Cardiovascular:     Rate and Rhythm: Tachycardia present. Rhythm irregular.  Pulmonary:     Effort: Pulmonary effort is normal. No respiratory distress.     Breath sounds: No wheezing or rales.  Abdominal:     General: There is no distension.     Palpations: Abdomen is  soft.  Musculoskeletal:        General: No tenderness.     Cervical back: Normal range of motion and neck supple.     Right lower leg: No edema.     Left lower leg: Edema (trace pitting left lower leg) present.  Skin:    General: Skin is warm and dry.  Neurological:     General: No focal deficit present.     Mental Status: She is alert and oriented to person, place, and time.  Psychiatric:        Mood and Affect: Mood normal.        Behavior: Behavior normal.    Assessment & Plan:  1: Chronic heart failure with preserved ejection fraction- - NYHA class I - euvolemic today - says that she's not being weighed daily at [facility]; order written for her to be weighed daily and to call for an overnight weight gain of >2 pounds or a weekly weight gain of >5 pounds - weight 169.6 from last visit here 6 months ago - does not add salt and when she's home, she tries to closely watch her sodium intake; niece that is present with her says that she doesn't cook with salt either - currently receiving PT at the facility - saw cardiology Zonia Kief) 10/03/19 - BNP 04/13/19 was 573.0  2: HTN- - BP  - saw PCP ([place]) 06/19/19 - BMP 06/19/19 reviewed and showed sodium 138, potassium 4.0, creatinine 0.87 and GFR 73  3: Aortic stenosis- - discussion about possible TAVR in the future - saw cardiothoracic surgeon Kizzie Bane) 09/17/19   Facility medication list was reviewed.

## 2019-10-16 ENCOUNTER — Ambulatory Visit: Payer: Medicare Other | Admitting: Family

## 2021-03-03 IMAGING — DX DG CHEST 1V PORT
1 series · 1 of 1 positions shown · non-contrast
Comparison: None.

CLINICAL DATA: Shortness of breath

EXAM:
PORTABLE CHEST 1 VIEW

[chest ap]
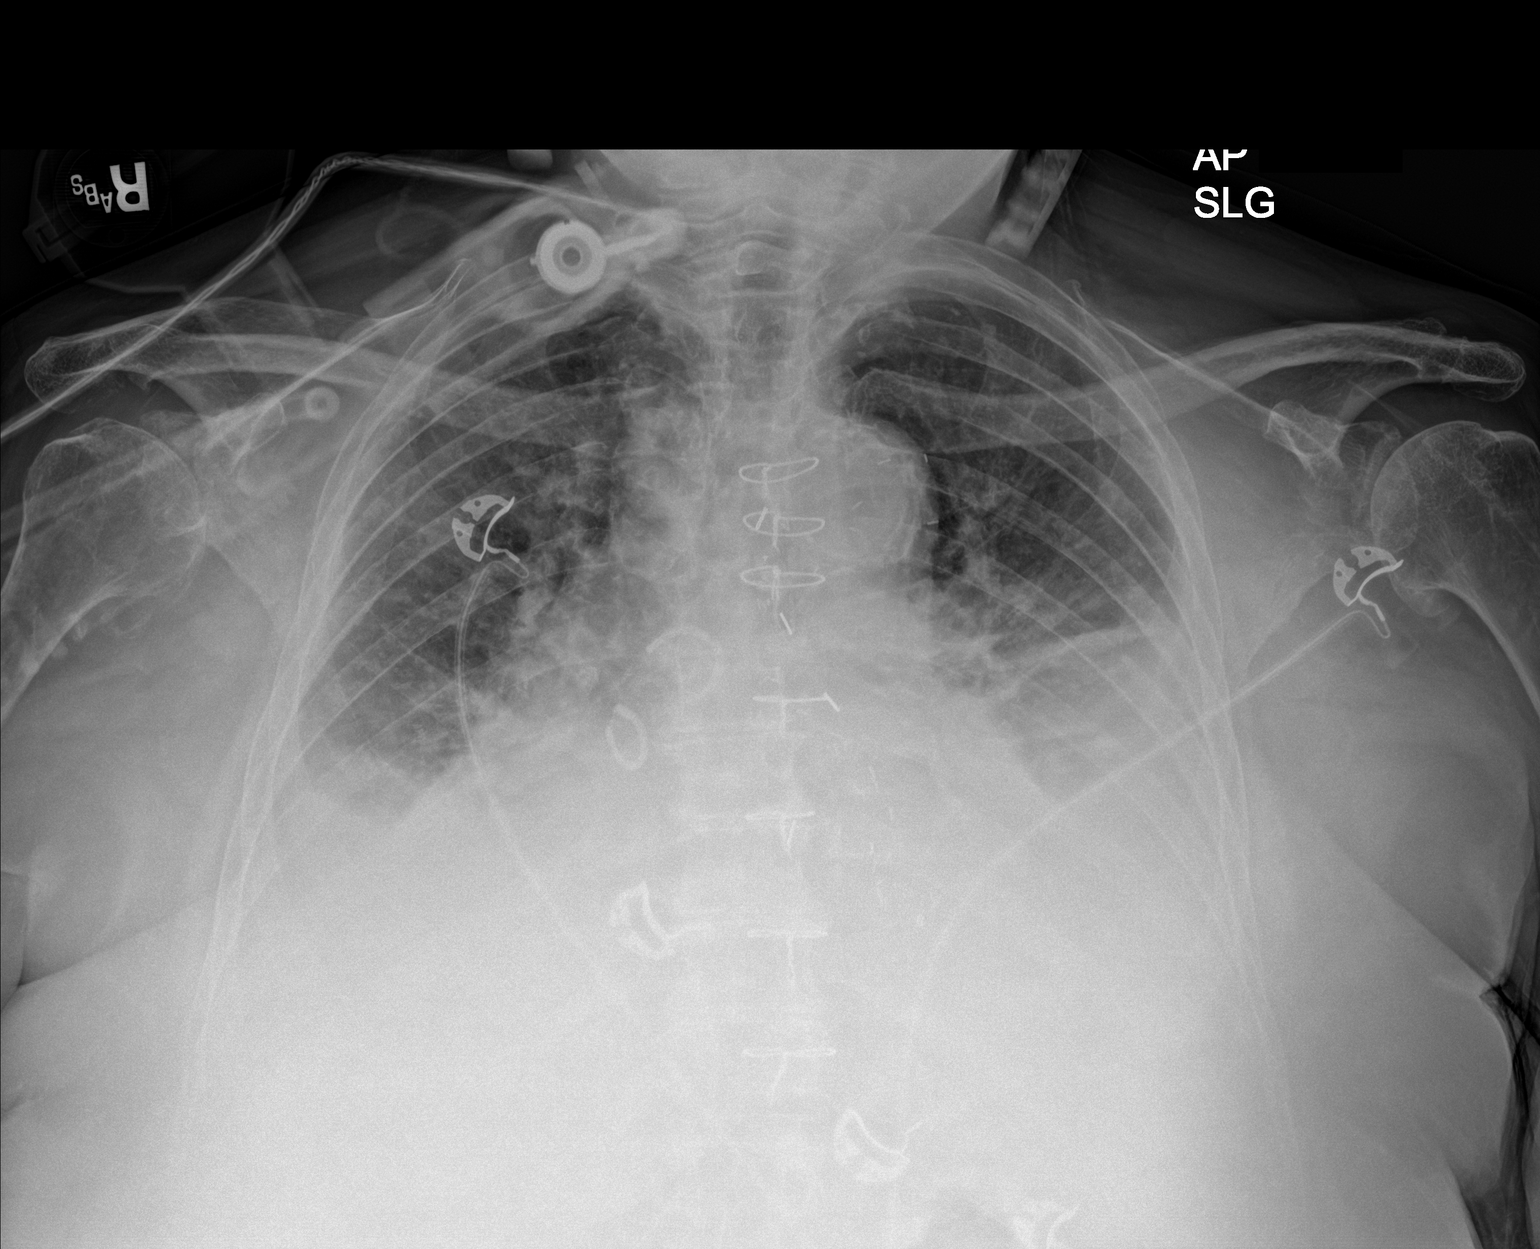

[1 of 1 positions shown; findings below may reference images not displayed]

FINDINGS: Moderate bilateral pleural effusions with adjacent atelectasis.
Probable mild pulmonary edema. No pneumothorax. Mild cardiomegaly.
IMPRESSION: Moderate bilateral pleural effusions with adjacent atelectasis.
Probable mild pulmonary edema. Mild cardiomegaly.

## 2021-03-04 IMAGING — DX DG CHEST 1V PORT
1 series · 1 of 1 positions shown · non-contrast
Comparison: 04/12/2019

CLINICAL DATA: SOB per physician order. Per physician notes -
Patient presented with complaints of cough and shortness of breath.
Found to have non-STEMI and acute hypoxic respiratory
failure.Currently further plan is continue aggressive diuresis. Pt
admitted yesterday for acute non-STEMI, acute on chronic possibly
diastolic CHF, acute hypoxic respiratory failure. Hx - A-fib,
stroke, CABG 4955, former smoker.

EXAM:
PORTABLE CHEST 1 VIEW

[chest ap]
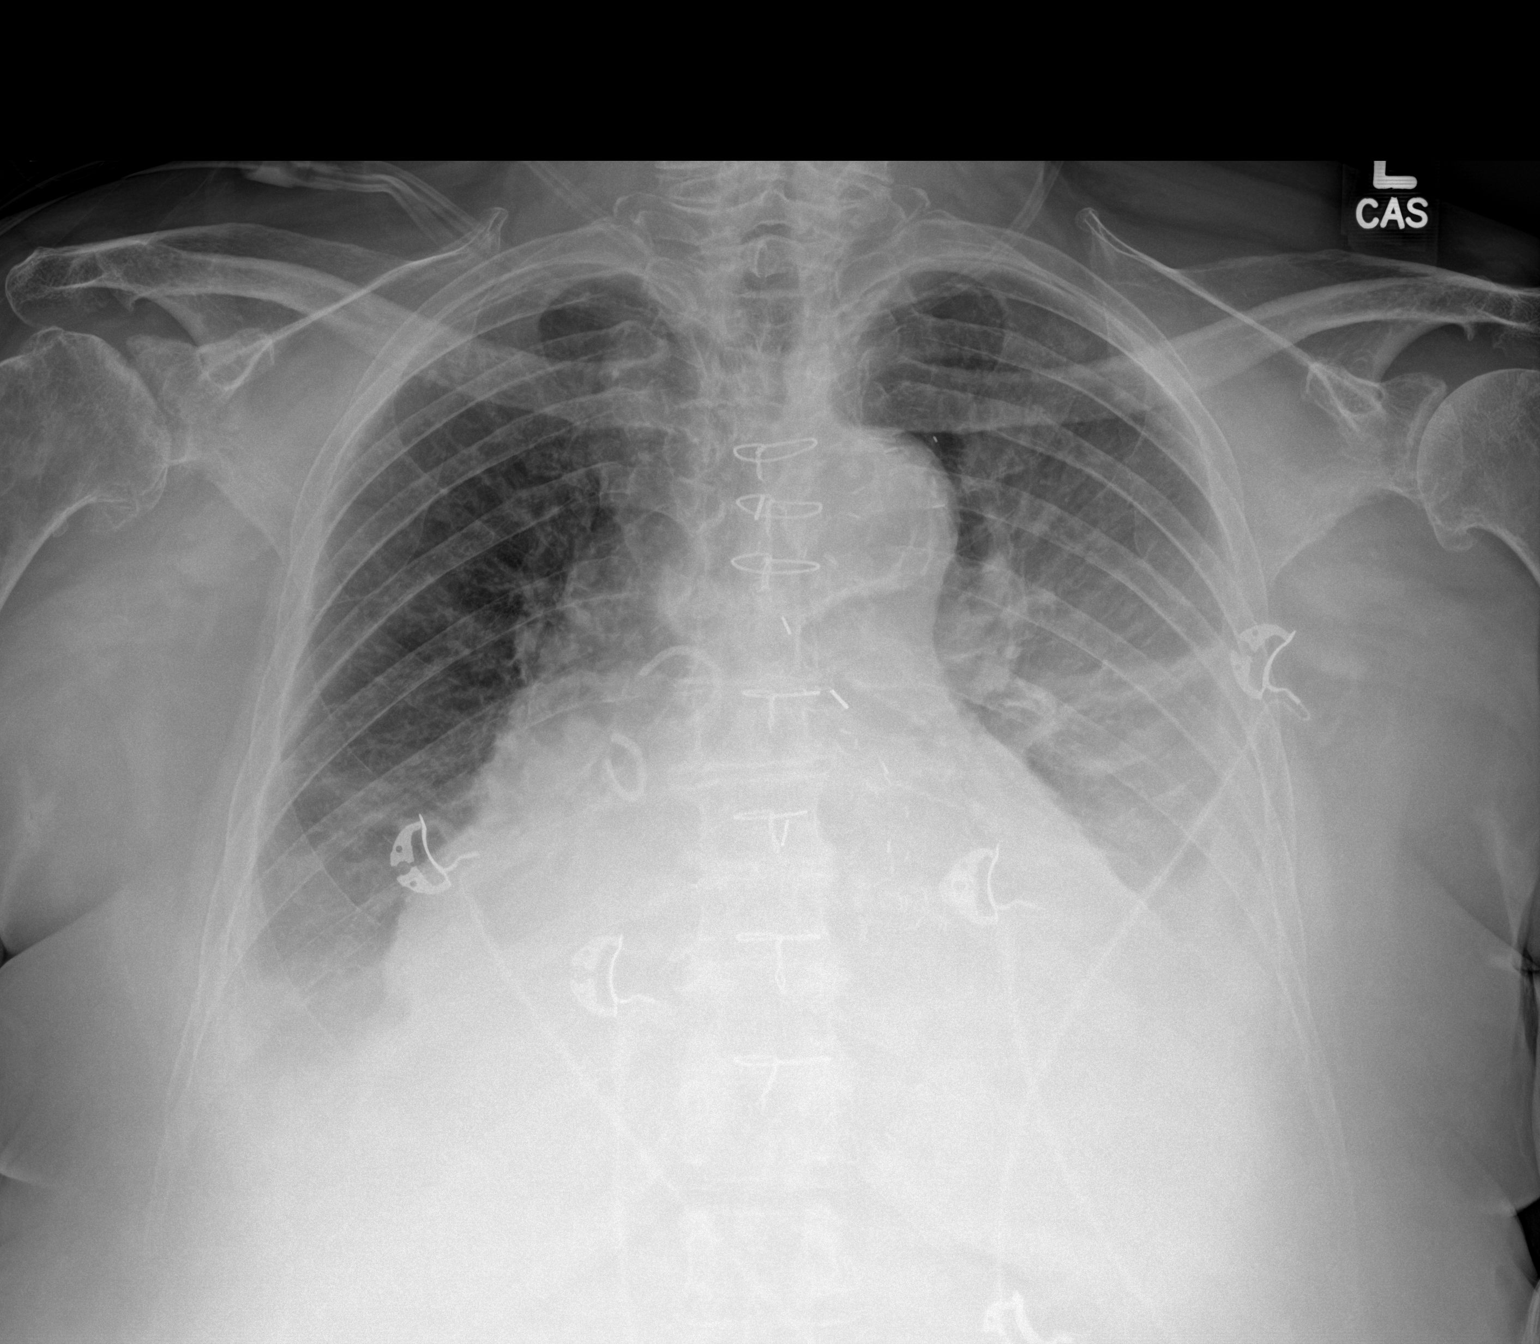

[1 of 1 positions shown; findings below may reference images not displayed]

FINDINGS: Stable cardiomegaly.

Moderate, left greater than right, pleural effusions. There are
linear opacities in the left mid lung most likely atelectasis.

No new lung abnormalities.  No pneumothorax.
IMPRESSION: 1. Cardiomegaly with moderate bilateral pleural effusions, left
greater than right, associated with atelectasis. Underlying
pneumonia is not excluded. No convincing pulmonary edema.

## 2021-03-07 IMAGING — DX DG ABD PORTABLE 1V
2 series · 2 of 2 positions shown · non-contrast
Comparison: None

CLINICAL DATA: Abdominal discomfort

EXAM:
PORTABLE ABDOMEN - 1 VIEW

[abdomen supine (1 of 2)]
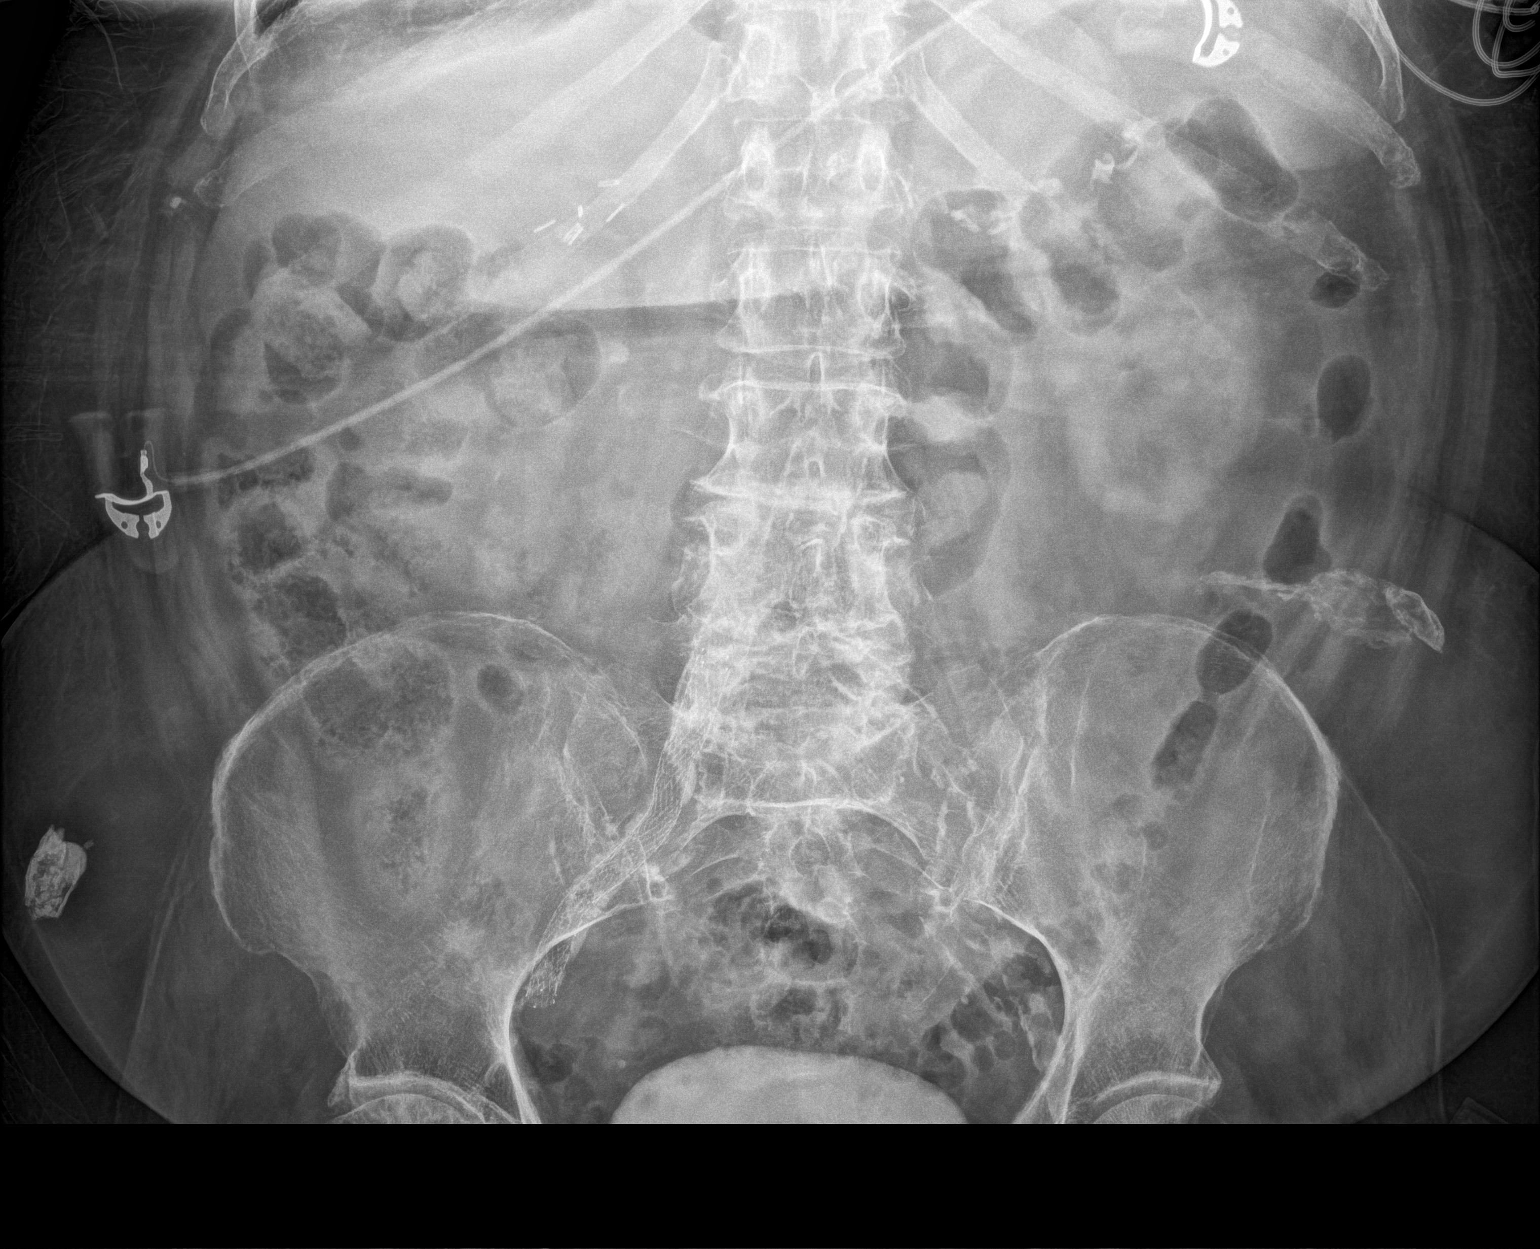

[abdomen supine (2 of 2)]
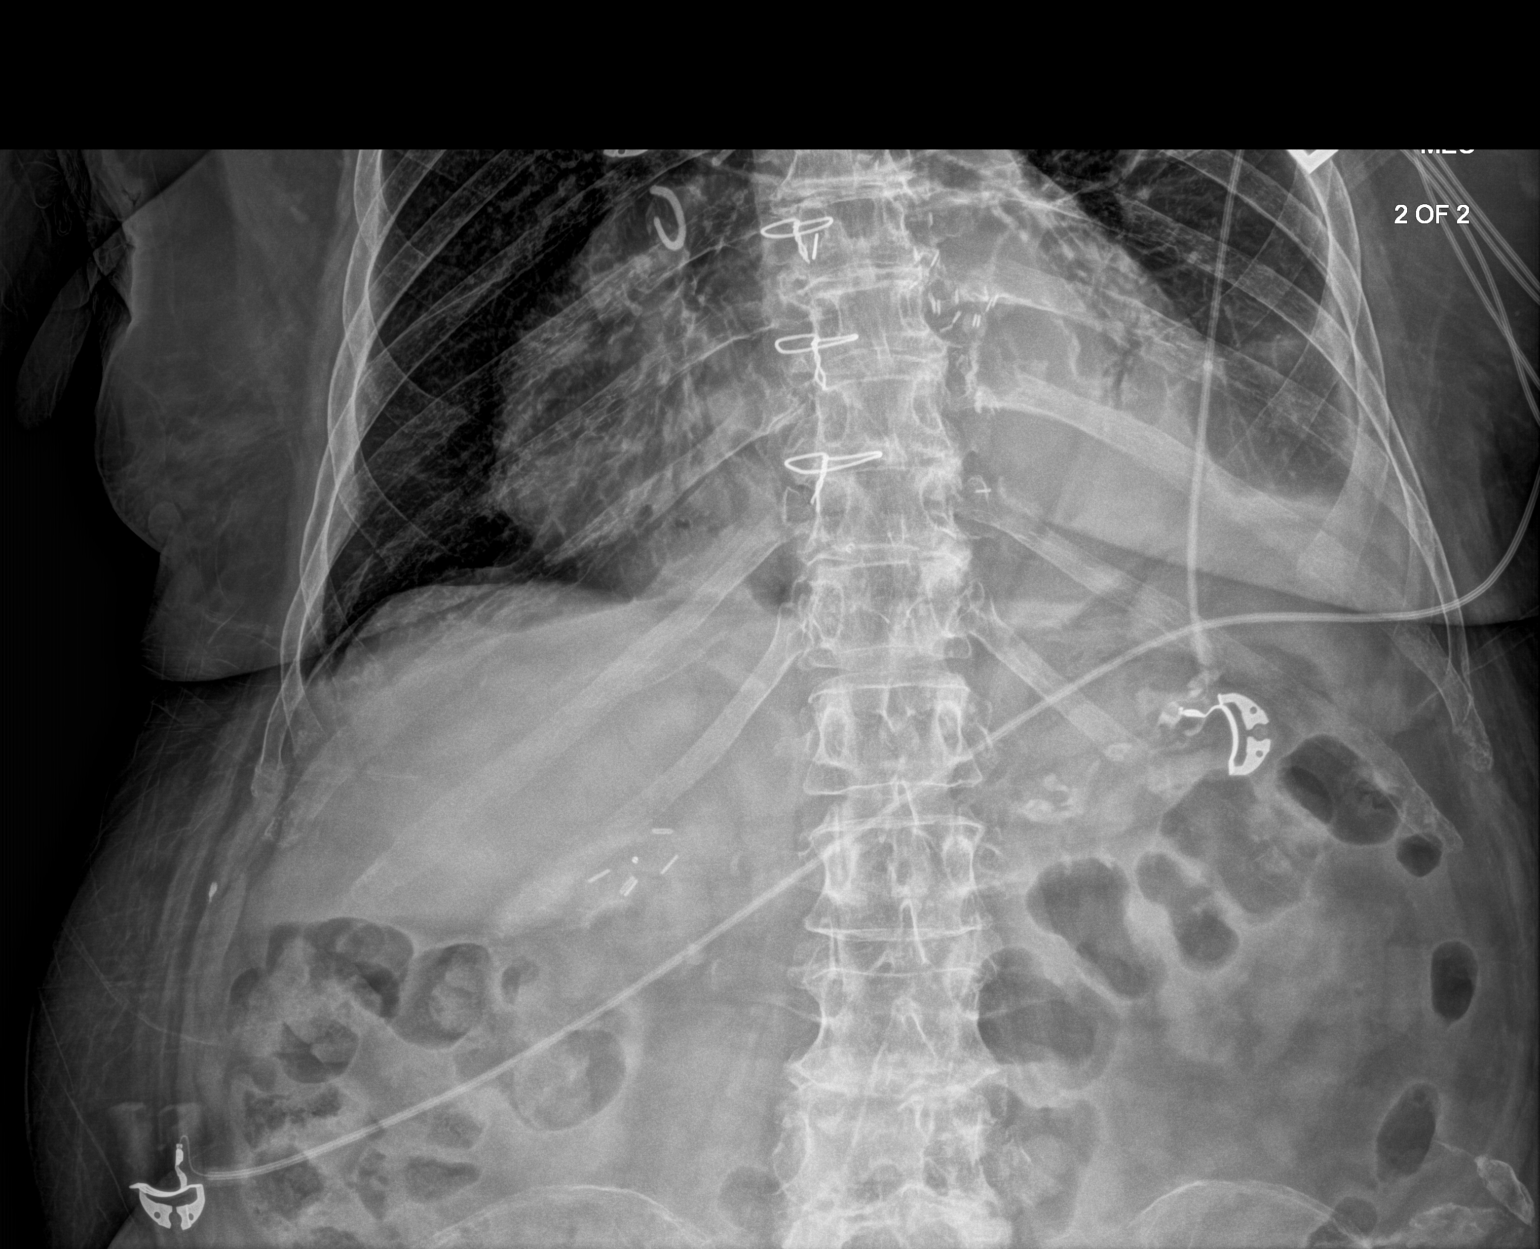

[2 of 2 positions shown; findings below may reference images not displayed]

FINDINGS: Bowel gas pattern is remarkable for sign of colonic diverticulosis
in the sigmoid colon. Urinary bladder obscures the rectum. No signs
of bowel obstruction.

Dense nephrograms bilaterally with excreted contrast in the urinary
bladder. No priors are available to confirm exam type or timing of
the previous study.

Question of nephrolithiasis on the right and left, limited
assessment due to bilateral nephrograms.

Signs of vascular disease with stenting of right iliac distribution.

Spinal degenerative changes without acute or destructive bone
process.
IMPRESSION: 1. Nonobstructive bowel gas pattern.
2. Colonic diverticulosis.
3. Dense nephrograms bilaterally with excreted contrast in the
urinary bladder. Correlate with timing of a contrasted evaluation
which has been recently performed. Also correlation with renal
function given dense bilateral nephrograms.
4. Query nephrolithiasis.
5. Signs of cholecystectomy.
6. Signs of vascular disease with stenting of right iliac
distribution.
7. These results will be called to the ordering clinician or
representative by the Radiologist Assistant, and communication
documented in the PACS or [REDACTED].
